# Patient Record
Sex: Female | Born: 1975 | Race: White | Hispanic: No | Smoking: Never smoker
Health system: Southern US, Community
[De-identification: ages and names within clinical notes are randomized; demographics above are authoritative.]

## PROBLEM LIST (undated history)

## (undated) HISTORY — PX: INTRAUTERINE DEVICE (IUD) INSERTION: SHX5877

## (undated) HISTORY — PX: SKIN TAG REMOVAL: SHX780

---

## 1998-12-23 ENCOUNTER — Other Ambulatory Visit: Admission: RE | Admit: 1998-12-23 | Discharge: 1998-12-23 | Payer: Self-pay | Admitting: Family Medicine

## 1999-02-19 ENCOUNTER — Encounter (INDEPENDENT_AMBULATORY_CARE_PROVIDER_SITE_OTHER): Payer: Self-pay

## 1999-02-19 ENCOUNTER — Other Ambulatory Visit: Admission: RE | Admit: 1999-02-19 | Discharge: 1999-02-19 | Payer: Self-pay | Admitting: Obstetrics & Gynecology

## 2000-03-31 ENCOUNTER — Other Ambulatory Visit: Admission: RE | Admit: 2000-03-31 | Discharge: 2000-03-31 | Payer: Self-pay | Admitting: Obstetrics & Gynecology

## 2001-04-02 ENCOUNTER — Other Ambulatory Visit: Admission: RE | Admit: 2001-04-02 | Discharge: 2001-04-02 | Payer: Self-pay | Admitting: Obstetrics & Gynecology

## 2002-04-10 ENCOUNTER — Other Ambulatory Visit: Admission: RE | Admit: 2002-04-10 | Discharge: 2002-04-10 | Payer: Self-pay | Admitting: Obstetrics & Gynecology

## 2003-04-16 ENCOUNTER — Other Ambulatory Visit: Admission: RE | Admit: 2003-04-16 | Discharge: 2003-04-16 | Payer: Self-pay | Admitting: Obstetrics & Gynecology

## 2005-07-08 ENCOUNTER — Ambulatory Visit (HOSPITAL_COMMUNITY): Admission: RE | Admit: 2005-07-08 | Discharge: 2005-07-08 | Payer: Self-pay | Admitting: *Deleted

## 2008-08-06 ENCOUNTER — Encounter: Admission: RE | Admit: 2008-08-06 | Discharge: 2008-08-06 | Payer: Self-pay | Admitting: Specialist

## 2010-07-09 NOTE — Op Note (Signed)
NAME:  Anne Haas, Anne Haas                   ACCOUNT NO.:  [id number]   MEDICAL RECORD NO.:  [id number]          PATIENT TYPE:  AMB   LOCATION:  DAY                          FACILITY:  [facility]   PHYSICIAN:  Alfonse Ras, MD   DATE OF BIRTH:  10-29-1975   DATE OF PROCEDURE:  07/08/2005  DATE OF DISCHARGE:                                 OPERATIVE REPORT   PREOPERATIVE DIAGNOSIS:  Anal skin tag.   POSTOPERATIVE DIAGNOSIS:  Anal skin tag.   PROCEDURE:  Excision of anal skin tag.   SURGEON:  Baruch Merl.   ANESTHESIA:  General.   DESCRIPTION:  The patient was taken to the operating room and placed in the  supine position.  After adequate general anesthesia was induced, the patient  was placed in the lithotomy position and perianal prep was undertaken.  The  anterior anal skin tag was grasped with the smooth graspers and transected  at its base.  The bare area was then coagulated using Bovie electrocautery  until adequate hemostasis was insured.  The defect was closed with a running  locking 3-0 chromic suture.  10 cc of 0.5 Marcaine was then injected under  the incision.  Adequate hemostasis was insured.  4 x 4's were placed and  mesh panties were placed.  The patient was taken to the recovery room in  good condition.      Alfonse Ras, MD  Electronically Signed     KRE/MEDQ  D:  07/08/2005  T:  07/08/2005  Job:  [id number]

## 2013-02-22 ENCOUNTER — Other Ambulatory Visit: Payer: Self-pay | Admitting: Family Medicine

## 2013-02-22 DIAGNOSIS — R109 Unspecified abdominal pain: Secondary | ICD-10-CM

## 2013-02-25 ENCOUNTER — Ambulatory Visit
Admission: RE | Admit: 2013-02-25 | Discharge: 2013-02-25 | Disposition: A | Discharge status: Home / Self Care | Payer: BC Managed Care – PPO | Source: Ambulatory Visit | Attending: Family Medicine | Admitting: Family Medicine

## 2013-02-25 DIAGNOSIS — R109 Unspecified abdominal pain: Secondary | ICD-10-CM

## 2014-11-14 ENCOUNTER — Encounter (HOSPITAL_COMMUNITY): Payer: Self-pay | Admitting: Emergency Medicine

## 2014-11-14 ENCOUNTER — Emergency Department (HOSPITAL_COMMUNITY)
Admission: EM | Admit: 2014-11-14 | Discharge: 2014-11-14 | Disposition: A | Discharge status: Home / Self Care | Payer: 59 | Source: Home / Self Care | Attending: Emergency Medicine | Admitting: Emergency Medicine

## 2014-11-14 DIAGNOSIS — L259 Unspecified contact dermatitis, unspecified cause: Secondary | ICD-10-CM | POA: Diagnosis not present

## 2014-11-14 MED ORDER — PREDNISONE 20 MG PO TABS: ORAL_TABLET | ORAL | Status: DC

## 2014-11-14 NOTE — Discharge Instructions (Signed)
You have poison ivy or poison sumac. Take the prednisone as prescribed. It is important to take the entire course. You can continue the over-the-counter topical agents and oral Benadryl to provide temporary relief of the itching. You should see improvement within the next 24 hours.

## 2014-11-14 NOTE — ED Provider Notes (Signed)
CSN: [id number]     Arrival date & time 11/14/14  1556 History   First MD Initiated Contact with Patient 11/14/14 1612     Chief Complaint  Patient presents with  . Rash   (Consider location/radiation/quality/duration/timing/severity/associated sxs/prior Treatment) HPI  She is a 39 year old woman here for evaluation of rash. She states she was doing yard work over the weekend, and has developed an itchy rash on her arms and legs. She states it has been spreading. It is intensely itchy. She has tried multiple over-the-counter creams, including Benadryl and calamine without improvement. She has also tried oral Benadryl without improvement. She has tried Epsom salts soaks as well and baking soda without improvement.  History reviewed. No pertinent past medical history. History reviewed. No pertinent past surgical history. No family history on file. Social History  Substance Use Topics  . Smoking status: Never Smoker   . Smokeless tobacco: None  . Alcohol Use: No   OB History    No data available     Review of Systems As in history of present illness Allergies  Review of patient's allergies indicates no known allergies.  Home Medications   Prior to Admission medications   Medication Sig Start Date End Date Taking? Authorizing Provider  levonorgestrel (MIRENA) 20 MCG/24HR IUD 1 each by Intrauterine route once.   Yes Historical Provider, MD  predniSONE (DELTASONE) 20 MG tablet Take 3 tablets daily for 5 days, then 2 tablets daily for 3 days, then 1 tablet daily for 3 days, then 1/2 tablet daily for 3 days 11/14/14   Melony Overly, MD   Meds Ordered and Administered this Visit  Medications - No data to display  BP 134/76 mmHg  Pulse 59  Temp(Src) 98.7 F (37.1 C) (Oral)  Resp 20  SpO2 100% No data found.   Physical Exam  Constitutional: She is oriented to person, place, and time. She appears well-developed and well-nourished. No distress.  Cardiovascular: Normal rate.    Pulmonary/Chest: Effort normal.  Neurological: She is alert and oriented to person, place, and time.  Skin: Rash (erythematous papulovesicular rash in linear pattern on arms and legs.) noted.    ED Course  Procedures (including critical care time)  Labs Review Labs Reviewed - No data to display  Imaging Review No results found.    MDM   1. Contact dermatitis    Treatment prednisone taper. Follow-up as needed.    Melony Overly, MD 11/14/14 [phone]

## 2014-11-14 NOTE — ED Notes (Signed)
Here for poss poison ivy/oak onset Sunday all over body Reports she was doing yard work Has tried OTC creams w/no relief Alert and oriented x4... No acute distress.

## 2015-02-25 DIAGNOSIS — D225 Melanocytic nevi of trunk: Secondary | ICD-10-CM | POA: Diagnosis not present

## 2015-02-25 DIAGNOSIS — D2239 Melanocytic nevi of other parts of face: Secondary | ICD-10-CM | POA: Diagnosis not present

## 2015-02-25 DIAGNOSIS — D2271 Melanocytic nevi of right lower limb, including hip: Secondary | ICD-10-CM | POA: Diagnosis not present

## 2015-02-25 DIAGNOSIS — L858 Other specified epidermal thickening: Secondary | ICD-10-CM | POA: Diagnosis not present

## 2015-02-27 DIAGNOSIS — Z30433 Encounter for removal and reinsertion of intrauterine contraceptive device: Secondary | ICD-10-CM | POA: Diagnosis not present

## 2015-03-26 DIAGNOSIS — Z30431 Encounter for routine checking of intrauterine contraceptive device: Secondary | ICD-10-CM | POA: Diagnosis not present

## 2015-11-30 DIAGNOSIS — B349 Viral infection, unspecified: Secondary | ICD-10-CM | POA: Diagnosis not present

## 2016-01-22 DIAGNOSIS — H1851 Endothelial corneal dystrophy: Secondary | ICD-10-CM | POA: Diagnosis not present

## 2016-01-22 DIAGNOSIS — H5213 Myopia, bilateral: Secondary | ICD-10-CM | POA: Diagnosis not present

## 2016-04-27 DIAGNOSIS — L858 Other specified epidermal thickening: Secondary | ICD-10-CM | POA: Diagnosis not present

## 2016-04-27 DIAGNOSIS — D225 Melanocytic nevi of trunk: Secondary | ICD-10-CM | POA: Diagnosis not present

## 2016-04-27 DIAGNOSIS — D2222 Melanocytic nevi of left ear and external auricular canal: Secondary | ICD-10-CM | POA: Diagnosis not present

## 2016-04-27 DIAGNOSIS — D2239 Melanocytic nevi of other parts of face: Secondary | ICD-10-CM | POA: Diagnosis not present

## 2016-04-27 DIAGNOSIS — D224 Melanocytic nevi of scalp and neck: Secondary | ICD-10-CM | POA: Diagnosis not present

## 2016-08-04 ENCOUNTER — Ambulatory Visit (INDEPENDENT_AMBULATORY_CARE_PROVIDER_SITE_OTHER): Payer: 59 | Admitting: Emergency Medicine

## 2016-08-04 ENCOUNTER — Encounter: Payer: Self-pay | Admitting: Emergency Medicine

## 2016-08-04 VITALS — BP 124/79 | HR 72 | Temp 99.1°F | Resp 16 | Ht 65.0 in | Wt 171.4 lb

## 2016-08-04 DIAGNOSIS — W57XXXA Bitten or stung by nonvenomous insect and other nonvenomous arthropods, initial encounter: Secondary | ICD-10-CM

## 2016-08-04 DIAGNOSIS — L03221 Cellulitis of neck: Secondary | ICD-10-CM

## 2016-08-04 MED ORDER — DOXYCYCLINE HYCLATE 100 MG PO TABS: 100.0000 mg | ORAL_TABLET | Freq: Two times a day (BID) | ORAL | 0 refills | Status: DC

## 2016-08-04 NOTE — Patient Instructions (Addendum)
   IF you received an x-ray today, you will receive an invoice from [facility]. Please contact [facility] at [phone] with questions or concerns regarding your invoice.   IF you received labwork today, you will receive an invoice from [facility]. Please contact [facility] at [phone] with questions or concerns regarding your invoice.   Our billing staff will not be able to assist you with questions regarding bills from these companies.  You will be contacted with the lab results as soon as they are available. The fastest way to get your results is to activate your My Chart account. Instructions are located on the last page of this paperwork. If you have not heard from us regarding the results in 2 weeks, please contact this office.      Insect Bite, Adult An insect bite can make your skin red, itchy, and swollen. Some insects can spread disease to people with a bite. However, most insect bites do not lead to disease, and most are not serious. Follow these instructions at home: Bite area care  Do not scratch the bite area.  Keep the bite area clean and dry.  Wash the bite area every day with soap and water as told by your doctor.  Check the bite area every day for signs of infection. Check for: ? More redness, swelling, or pain. ? Fluid or blood. ? Warmth. ? Pus. Managing pain, itching, and swelling  You may put any of these on the bite area as told by your doctor: ? A baking soda paste. ? Cortisone cream. ? Calamine lotion.  If directed, put ice on the bite area. ? Put ice in a plastic bag. ? Place a towel between your skin and the bag. ? Leave the ice on for 20 minutes, 2-3 times a day. Medicines  Take medicines or put medicines on your skin only as told by your doctor.  If you were prescribed an antibiotic medicine, use it as told by your doctor. Do not stop using the antibiotic even if your condition improves. General instructions  Keep all  follow-up visits as told by your doctor. This is important. How is this prevented? To help you have a lower risk of insect bites:  When you are outside, wear clothing that covers your arms and legs.  Use insect repellent. The best insect repellents have: ? An active ingredient of DEET, picaridin, oil of lemon eucalyptus (OLE), or IR3535. ? Higher amounts of DEET or another active ingredient than other repellents have.  If your home windows do not have screens, think about putting some in.  Contact a doctor if:  You have more redness, swelling, or pain in the bite area.  You have fluid, blood, or pus coming from the bite area.  The bite area feels warm.  You have a fever. Get help right away if:  You have joint pain.  You have a rash.  You have shortness of breath.  You feel more tired or sleepy than you normally do.  You have neck pain.  You have a headache.  You feel weaker than you normally do.  You have chest pain.  You have pain in your belly.  You feel sick to your stomach (nauseous) or you throw up (vomit). Summary  An insect bite can make your skin red, itchy, and swollen.  Do not scratch the bite area, and keep it clean and dry.  Ice can help with pain and itching from the bite. This information is   not intended to replace advice given to you by your health care provider. Make sure you discuss any questions you have with your health care provider. Document Released: 02/05/2000 Document Revised: 09/10/2015 Document Reviewed: 06/25/2014 Elsevier Interactive Patient Education  2018 Elsevier Inc.  

## 2016-08-04 NOTE — Progress Notes (Addendum)
Anne Haas 41 y.o.   Chief Complaint  Patient presents with  . Insect Bite    On neck, redness on neck since Monday     HISTORY OF PRESENT ILLNESS: This is a 41 y.o. female complaining of insect bite to left side of neck 3 days ago followed by worsening redness and itching.  HPI   Prior to Admission medications   Medication Sig Start Date End Date Taking? Authorizing Provider  levonorgestrel (MIRENA) 20 MCG/24HR IUD 1 each by Intrauterine route once.   Yes [provider]  doxycycline (VIBRA-TABS) 100 MG tablet Take 1 tablet (100 mg total) by mouth 2 (two) times daily. 08/04/16   Horald Pollen, MD  predniSONE (DELTASONE) 20 MG tablet Take 3 tablets daily for 5 days, then 2 tablets daily for 3 days, then 1 tablet daily for 3 days, then 1/2 tablet daily for 3 days Patient not taking: Reported on 08/04/2016 11/14/14   Melony Overly, MD    Allergies  Allergen Reactions  . Septra [Sulfamethoxazole-Trimethoprim] Diarrhea    Patient Active Problem List   Diagnosis Date Noted  . Bite, insect 08/04/2016    No past medical history on file.  No past surgical history on file.  Social History   Social History  . Marital status: Single    Spouse name: N/A  . Number of children: N/A  . Years of education: N/A   Occupational History  . Not on file.   Social History Main Topics  . Smoking status: Never Smoker  . Smokeless tobacco: Not on file  . Alcohol use No  . Drug use: No  . Sexual activity: Not on file   Other Topics Concern  . Not on file   Social History Narrative  . No narrative on file    No family history on file.   Review of Systems  Constitutional: Negative for chills and fever.  Respiratory: Negative for shortness of breath.   Gastrointestinal: Negative for nausea and vomiting.  Skin: Positive for rash.  Neurological: Negative for dizziness and headaches.  All other systems reviewed and are negative.  Vitals:   08/04/16 1632  BP:  124/79  Pulse: 72  Resp: 16  Temp: 99.1 F (37.3 C)     Physical Exam  Constitutional: She appears well-developed and well-nourished.  HENT:  Head: Normocephalic and atraumatic.  Eyes: EOM are normal. Pupils are equal, round, and reactive to light.  Neck: Normal range of motion. Neck supple.  Cardiovascular: Normal rate and regular rhythm.   Pulmonary/Chest: Effort normal.  Musculoskeletal: Normal range of motion.  Skin: Skin is warm and dry. Capillary refill takes less than 2 seconds. Rash (left neck: +erythema, circular area 4-5 cm diameter) noted.  Psychiatric: She has a normal mood and affect. Her behavior is normal.  Vitals reviewed.    ASSESSMENT & PLAN: Anne Haas was seen today for insect bite.  Diagnoses and all orders for this visit:  Cellulitis of neck  Insect bite, initial encounter Comments: with infection  Other orders -     doxycycline (VIBRA-TABS) 100 MG tablet; Take 1 tablet (100 mg total) by mouth 2 (two) times daily.    Patient Instructions       IF you received an x-ray today, you will receive an invoice from [facility]. Please contact [facility] at [phone] with questions or concerns regarding your invoice.   IF you received labwork today, you will receive an invoice from [facility]. Please contact [facility] at [phone]  with questions or concerns regarding your invoice.   Our billing staff will not be able to assist you with questions regarding bills from these companies.  You will be contacted with the lab results as soon as they are available. The fastest way to get your results is to activate your My Chart account. Instructions are located on the last page of this paperwork. If you have not heard from [place] regarding the results in 2 weeks, please contact this office.      Insect Bite, Adult An insect bite can make your skin red, itchy, and swollen. Some insects can spread disease to people with a bite. However, most  insect bites do not lead to disease, and most are not serious. Follow these instructions at home: Bite area care  Do not scratch the bite area.  Keep the bite area clean and dry.  Wash the bite area every day with soap and water as told by your doctor.  Check the bite area every day for signs of infection. Check for: ? More redness, swelling, or pain. ? Fluid or blood. ? Warmth. ? Pus. Managing pain, itching, and swelling  You may put any of these on the bite area as told by your doctor: ? A baking soda paste. ? Cortisone cream. ? Calamine lotion.  If directed, put ice on the bite area. ? Put ice in a plastic bag. ? Place a towel between your skin and the bag. ? Leave the ice on for 20 minutes, 2-3 times a day. Medicines  Take medicines or put medicines on your skin only as told by your doctor.  If you were prescribed an antibiotic medicine, use it as told by your doctor. Do not stop using the antibiotic even if your condition improves. General instructions  Keep all follow-up visits as told by your doctor. This is important. How is this prevented? To help you have a lower risk of insect bites:  When you are outside, wear clothing that covers your arms and legs.  Use insect repellent. The best insect repellents have: ? An active ingredient of DEET, picaridin, oil of lemon eucalyptus (OLE), or IR3535. ? Higher amounts of DEET or another active ingredient than other repellents have.  If your home windows do not have screens, think about putting some in.  Contact a doctor if:  You have more redness, swelling, or pain in the bite area.  You have fluid, blood, or pus coming from the bite area.  The bite area feels warm.  You have a fever. Get help right away if:  You have joint pain.  You have a rash.  You have shortness of breath.  You feel more tired or sleepy than you normally do.  You have neck pain.  You have a headache.  You feel weaker than you  normally do.  You have chest pain.  You have pain in your belly.  You feel sick to your stomach (nauseous) or you throw up (vomit). Summary  An insect bite can make your skin red, itchy, and swollen.  Do not scratch the bite area, and keep it clean and dry.  Ice can help with pain and itching from the bite. This information is not intended to replace advice given to you by your health care provider. Make sure you discuss any questions you have with your health care provider. Document Released: 02/05/2000 Document Revised: 09/10/2015 Document Reviewed: 06/25/2014 Elsevier Interactive Patient Education  2018 [employer].  Agustina Caroli, MD [facility]

## 2017-02-01 DIAGNOSIS — H5213 Myopia, bilateral: Secondary | ICD-10-CM | POA: Diagnosis not present

## 2017-06-12 ENCOUNTER — Ambulatory Visit: Payer: No Typology Code available for payment source | Admitting: Obstetrics & Gynecology

## 2017-06-12 VITALS — Ht 65.0 in | Wt 162.0 lb

## 2017-06-12 DIAGNOSIS — N6320 Unspecified lump in the left breast, unspecified quadrant: Secondary | ICD-10-CM | POA: Diagnosis not present

## 2017-06-12 NOTE — Progress Notes (Signed)
    Anne Haas 1975-09-03 [id number]        42 y.o.  G1P0A1 Single  RP: Left breast lumps x 3 weeks  HPI:  C/O feeling small lumps on the left breast x 3 weeks.  Patient noticed the area after being pushed by her horse's nose at that location.  Felt tender, so started palpating that area and felt it was lumpy.  No longer tender.  No change in skin, no nipple discharge.  No family h/o Breast Ca.  No screening Mammo done yet.  Well on Mirena IUD.  Abstinent.   OB History  No data available    Past medical history,surgical history, problem list, medications, allergies, family history and social history were all reviewed and documented in the EPIC chart.   Directed ROS with pertinent positives and negatives documented in the history of present illness/assessment and plan.  Exam:  Vitals:   06/12/17 1457  Weight: 162 lb (73.5 kg)  Height: 5\' 5"  (1.651 m)   General appearance:  Normal  Breast exam:  Physical Exam  Respiratory:         Assessment/Plan:  42 y.o. No obstetric history on file.   1. Left breast lump Tenderness of the mid inner left breast after mild trauma 3 weeks ago.  Patient felt small lumps in that area since that time.  On exam today no clear nodule or mass, but mildly increased density at the inner mid left breast which correspond to the 9:00 area.  Bilateral breast exam otherwise negative.  No family history of breast cancer.  Decision to proceed with a left diagnostic mammogram and ultrasound.  Patient reassured.  Counseling on above issues and coordination of care more than 50% for 15 minutes.  Princess Bruins MD, 3:22 PM 06/12/2017

## 2017-06-15 ENCOUNTER — Telehealth: Payer: Self-pay | Admitting: *Deleted

## 2017-06-15 ENCOUNTER — Encounter: Payer: Self-pay | Admitting: Obstetrics & Gynecology

## 2017-06-15 DIAGNOSIS — N6320 Unspecified lump in the left breast, unspecified quadrant: Secondary | ICD-10-CM

## 2017-06-15 NOTE — Patient Instructions (Signed)
1. Left breast lump Tenderness of the mid inner left breast after mild trauma 3 weeks ago.  Patient felt small lumps in that area since that time.  On exam today no clear nodule or mass, but mildly increased density at the inner mid left breast which correspond to the 9:00 area.  Bilateral breast exam otherwise negative.  No family history of breast cancer.  Decision to proceed with a left diagnostic mammogram and ultrasound.  Patient reassured.  Anne Haas, it was a pleasure seeing you today!  I will review your Diagnostic Mammogram and [place] as soon as available.

## 2017-06-15 NOTE — Telephone Encounter (Signed)
Patient scheduled on 06/19/17 @ 7:50am at breast center, pt informed.

## 2017-06-15 NOTE — Telephone Encounter (Signed)
-----   Message from Princess Bruins, MD sent at 06/12/2017  3:38 PM EDT ----- Regarding: Left breast Dx Mammo/US Left breast increased density at 9 O'clock at periphery of breast.  No other findings.  Lt Dx mammo/US.  Right Screening Mammo as well.

## 2017-06-19 ENCOUNTER — Ambulatory Visit
Admission: RE | Admit: 2017-06-19 | Discharge: 2017-06-19 | Disposition: A | Discharge status: Home / Self Care | Payer: No Typology Code available for payment source | Source: Ambulatory Visit | Attending: Obstetrics & Gynecology | Admitting: Obstetrics & Gynecology

## 2017-06-19 ENCOUNTER — Ambulatory Visit
Admission: RE | Admit: 2017-06-19 | Discharge: 2017-06-19 | Disposition: A | Discharge status: Home / Self Care | Payer: 59 | Source: Ambulatory Visit | Attending: Obstetrics & Gynecology | Admitting: Obstetrics & Gynecology

## 2017-06-19 DIAGNOSIS — N6320 Unspecified lump in the left breast, unspecified quadrant: Secondary | ICD-10-CM

## 2017-07-25 ENCOUNTER — Encounter: Payer: Self-pay | Admitting: Obstetrics & Gynecology

## 2017-07-25 ENCOUNTER — Ambulatory Visit (INDEPENDENT_AMBULATORY_CARE_PROVIDER_SITE_OTHER): Payer: No Typology Code available for payment source | Admitting: Obstetrics & Gynecology

## 2017-07-25 VITALS — BP 126/78 | Ht 65.0 in | Wt 159.0 lb

## 2017-07-25 DIAGNOSIS — Z1151 Encounter for screening for human papillomavirus (HPV): Secondary | ICD-10-CM | POA: Diagnosis not present

## 2017-07-25 DIAGNOSIS — Z30431 Encounter for routine checking of intrauterine contraceptive device: Secondary | ICD-10-CM

## 2017-07-25 DIAGNOSIS — Z01419 Encounter for gynecological examination (general) (routine) without abnormal findings: Secondary | ICD-10-CM | POA: Diagnosis not present

## 2017-07-25 NOTE — Progress Notes (Signed)
Anne Haas 03/21/1975 [id number]   History:    42 y.o. G1P0A1 Single.  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  RP:  Established patient presenting for annual gyn exam.  Recent Mammo, negative.   HPI: Well on Mirena IUD x 02/2014.  No breakthrough bleeding.  No pelvic pain.  Normal vaginal secretions.  Abstinent since last exam.  Urine and bowel movements normal.  Breasts normal.  Screening mammogram negative in April 2019.  Will follow-up fasting for health labs  here.  Normal gynecologic exam.  Past medical history,surgical history, family history and social history were all reviewed and documented in the EPIC chart.  Gynecologic History No LMP recorded. (Menstrual status: IUD). Contraception: Mirena IUD x 02/2014 Last Pap: 2016. Results were: normal Last mammogram: 05/2017. Results were: Negative Bone Density: Never Colonoscopy: Never  Obstetric History OB History  Gravida Para Term Preterm AB Living  1 0     1 0  SAB TAB Ectopic Multiple Live Births               # Outcome Date GA Lbr Len/2nd Weight Sex Delivery Anes PTL Lv  1 AB              ROS: A ROS was performed and pertinent positives and negatives are included in the history.  GENERAL: No fevers or chills. HEENT: No change in vision, no earache, sore throat or sinus congestion. NECK: No pain or stiffness. CARDIOVASCULAR: No chest pain or pressure. No palpitations. PULMONARY: No shortness of breath, cough or wheeze. GASTROINTESTINAL: No abdominal pain, nausea, vomiting or diarrhea, melena or bright red blood per rectum. GENITOURINARY: No urinary frequency, urgency, hesitancy or dysuria. MUSCULOSKELETAL: No joint or muscle pain, no back pain, no recent trauma. DERMATOLOGIC: No rash, no itching, no lesions. ENDOCRINE: No polyuria, polydipsia, no heat or cold intolerance. No recent change in weight. HEMATOLOGICAL: No anemia or easy bruising or bleeding. NEUROLOGIC: No headache, seizures, numbness,  tingling or weakness. PSYCHIATRIC: No depression, no loss of interest in normal activity or change in sleep pattern.     Exam:   BP 126/78   Ht '5\' 5"'$  (1.651 m)   Wt 159 lb (72.1 kg)   BMI 26.46 kg/m   Body mass index is 26.46 kg/m.  General appearance : Well developed well nourished female. No acute distress HEENT: Eyes: no retinal hemorrhage or exudates,  Neck supple, trachea midline, no carotid bruits, no thyroidmegaly Lungs: Clear to auscultation, no rhonchi or wheezes, or rib retractions  Heart: Regular rate and rhythm, no murmurs or gallops Breast:Examined in sitting and supine position were symmetrical in appearance, no palpable masses or tenderness,  no skin retraction, no nipple inversion, no nipple discharge, no skin discoloration, no axillary or supraclavicular lymphadenopathy Abdomen: no palpable masses or tenderness, no rebound or guarding Extremities: no edema or skin discoloration or tenderness  Pelvic: Vulva: Normal             Vagina: No gross lesions or discharge  Cervix: No gross lesions or discharge.  IUD strings seen.  Pap/HR HPV done.  Uterus  AV, normal size, shape and consistency, non-tender and mobile  Adnexa  Without masses or tenderness  Anus: Normal   Assessment/Plan:  42 y.o. female for annual exam   1. Encounter for routine gynecological examination with Papanicolaou smear of cervix Normal gynecologic exam.  Pap with high-risk HPV done today.  Breast exam normal.  Screening mammogram -April 2019.  Follow-up health  labs here. - CBC; Future - Comp Met (CMET); Future - Lipid panel; Future - TSH; Future - VITAMIN D 25 Hydroxy (Vit-D Deficiency, Fractures); Future - PAP,TP IMGw/HPV RNA,rflx HPVTYPE16,18/45  2. Encounter for routine checking of intrauterine contraceptive device (IUD) Mirena IUD inserted in 2016.  Well-tolerated and in good position.  3. Special screening examination for human papillomavirus (HPV)  - PAP,TP IMGw/HPV RNA,rflx  [id number],81/18  Princess Bruins MD, 3:45 PM 07/25/2017

## 2017-07-26 LAB — PAP, TP IMAGING W/ HPV RNA, RFLX HPV TYPE 16,18/45: HPV DNA High Risk: NOT DETECTED

## 2017-07-29 ENCOUNTER — Encounter: Payer: Self-pay | Admitting: Obstetrics & Gynecology

## 2017-07-29 NOTE — Patient Instructions (Signed)
1. Encounter for routine gynecological examination with Papanicolaou smear of cervix Normal gynecologic exam.  Pap with high-risk HPV done today.  Breast exam normal.  Screening mammogram -April 2019.  Follow-up health labs here. - CBC; Future - Comp Met (CMET); Future - Lipid panel; Future - TSH; Future - VITAMIN D 25 Hydroxy (Vit-D Deficiency, Fractures); Future - PAP,TP IMGw/HPV RNA,rflx HPVTYPE16,18/45  2. Encounter for routine checking of intrauterine contraceptive device (IUD) Mirena IUD inserted in 2016.  Well-tolerated and in good position.  3. Special screening examination for human papillomavirus (HPV)  - PAP,TP IMGw/HPV RNA,rflx [id number],25/49  Rola, it was a pleasure seeing you today!  I will inform you of your results as soon as they are available.

## 2017-08-11 ENCOUNTER — Other Ambulatory Visit: Payer: No Typology Code available for payment source

## 2017-08-11 DIAGNOSIS — Z01419 Encounter for gynecological examination (general) (routine) without abnormal findings: Secondary | ICD-10-CM

## 2017-08-12 LAB — COMPREHENSIVE METABOLIC PANEL
AG RATIO: 2 (calc) (ref 1.0–2.5)
ALBUMIN MSPROF: 4.4 g/dL (ref 3.6–5.1)
ALKALINE PHOSPHATASE (APISO): 54 U/L (ref 33–115)
ALT: 12 U/L (ref 6–29)
AST: 13 U/L (ref 10–30)
BILIRUBIN TOTAL: 0.8 mg/dL (ref 0.2–1.2)
BUN: 15 mg/dL (ref 7–25)
CALCIUM: 9.4 mg/dL (ref 8.6–10.2)
CHLORIDE: 105 mmol/L (ref 98–110)
CO2: 30 mmol/L (ref 20–32)
Creat: 0.84 mg/dL (ref 0.50–1.10)
GLOBULIN: 2.2 g/dL (ref 1.9–3.7)
Glucose, Bld: 87 mg/dL (ref 65–99)
POTASSIUM: 4.2 mmol/L (ref 3.5–5.3)
Sodium: 141 mmol/L (ref 135–146)
Total Protein: 6.6 g/dL (ref 6.1–8.1)

## 2017-08-12 LAB — TSH: TSH: 2.07 mIU/L

## 2017-08-12 LAB — CBC
HCT: 39.9 % (ref 35.0–45.0)
Hemoglobin: 13.7 g/dL (ref 11.7–15.5)
MCH: 31.1 pg (ref 27.0–33.0)
MCHC: 34.3 g/dL (ref 32.0–36.0)
MCV: 90.5 fL (ref 80.0–100.0)
MPV: 10.1 fL (ref 7.5–12.5)
Platelets: 276 10*3/uL (ref 140–400)
RBC: 4.41 10*6/uL (ref 3.80–5.10)
RDW: 11.7 % (ref 11.0–15.0)
WBC: 6.1 10*3/uL (ref 3.8–10.8)

## 2017-08-12 LAB — LIPID PANEL
CHOLESTEROL: 181 mg/dL (ref <200)
HDL: 54 mg/dL (ref >50)
LDL Cholesterol (Calc): 113 mg/dL (calc) — ABNORMAL HIGH
Non-HDL Cholesterol (Calc): 127 mg/dL (calc) (ref <130)
Total CHOL/HDL Ratio: 3.4 (calc) (ref <5.0)
Triglycerides: 53 mg/dL (ref <150)

## 2017-08-12 LAB — VITAMIN D 25 HYDROXY (VIT D DEFICIENCY, FRACTURES): VIT D 25 HYDROXY: 34 ng/mL (ref 30–100)

## 2018-02-14 ENCOUNTER — Telehealth: Payer: No Typology Code available for payment source | Admitting: Family

## 2018-02-14 DIAGNOSIS — R197 Diarrhea, unspecified: Secondary | ICD-10-CM

## 2018-02-14 NOTE — Progress Notes (Signed)
Based on what you shared with me it looks like you have a serious condition that should be evaluated in a face to face office visit.  NOTE: If you entered your credit card information for this eVisit, you will not be charged. You may see a "hold" on your card for the $30 but that hold will drop off and you will not have a charge processed.  If you are having a true medical emergency please call 911.  If you need an urgent face to face visit, [facility] has four urgent care centers for your convenience.  If you need care fast and have a high deductible or no insurance consider:   [email or web address] to reserve your spot online an avoid wait times  [facility] [address], [address] [place], [place] [address] 8 am to 8 pm Monday-Friday 10 am to 4 pm Saturday-Sunday *Across the street from [employer]  [facility]  [address] [place], [address] 8 am to 5 pm Monday-Friday * In the [facility] the [facility]   The following sites will take your  insurance:  . [facility]  [phone] Get Driving Directions Find a Provider at this Location  [address] [place], [place] [address] . 10 am to 8 pm Monday-Friday . 12 pm to 8 pm Saturday-Sunday   . [facility]  [phone] Get Driving Directions Find a Provider at this Location  [address], [address] , [place] [address] . 8 am to 8 pm Monday-Friday . 9 am to 6 pm Saturday . 11 am to 6 pm Sunday   .  [facility] [place]  [phone] Get Driving Directions  [address] [place],  [address] . 8 am to 8 pm Monday-Friday . 8 am to 4 pm Saturday-Sunday   Your e-visit answers were reviewed by a board certified advanced clinical practitioner to complete your personal care plan.  Thank you for using e-Visits.  

## 2018-05-06 ENCOUNTER — Encounter (HOSPITAL_COMMUNITY): Payer: Self-pay | Admitting: Emergency Medicine

## 2018-05-06 ENCOUNTER — Ambulatory Visit (HOSPITAL_COMMUNITY)
Admission: EM | Admit: 2018-05-06 | Discharge: 2018-05-06 | Disposition: A | Discharge status: Home / Self Care | Payer: No Typology Code available for payment source | Attending: Internal Medicine | Admitting: Internal Medicine

## 2018-05-06 ENCOUNTER — Other Ambulatory Visit: Payer: Self-pay

## 2018-05-06 DIAGNOSIS — R6889 Other general symptoms and signs: Secondary | ICD-10-CM | POA: Insufficient documentation

## 2018-05-06 LAB — INFLUENZA PANEL BY PCR (TYPE A & B)
INFLBPCR: NEGATIVE
Influenza A By PCR: NEGATIVE

## 2018-05-06 LAB — POCT RAPID STREP A: STREPTOCOCCUS, GROUP A SCREEN (DIRECT): NEGATIVE

## 2018-05-06 MED ORDER — OSELTAMIVIR PHOSPHATE 75 MG PO CAPS: 75.0000 mg | ORAL_CAPSULE | Freq: Two times a day (BID) | ORAL | 0 refills | Status: DC

## 2018-05-06 NOTE — Discharge Instructions (Signed)
Drink lots of fluids, bone broth which helps boost your immune system ans well as Zinc lozenges.

## 2018-05-06 NOTE — ED Provider Notes (Signed)
[place]    CSN: [id number] Arrival date & time: 05/06/18  1415     History   Chief Complaint Chief Complaint  Patient presents with  . Flu-Like Symptoms    HPI Anne Haas is a 43 y.o. female.   Onset of rhintiis yesterday pm, and started some aches, HA, fever with body aches last night and feels worse today. This am woke up and temp was 100, and later on after taking Advil 400 mg 2 h ago her temp went up to 101. Has mild cough only. No wheezing, SOB, chest pains. 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 She usually never gets sick. Did have a flu shot this year.      History reviewed. No pertinent past medical history.  Patient Active Problem List   Diagnosis Date Noted  . Bite, insect 08/04/2016    History reviewed. No pertinent surgical history.  OB History    Gravida  1   Para  0   Term      Preterm      AB  1   Living  0     SAB      TAB      Ectopic      Multiple      Live Births               Home Medications    Prior to Admission medications   Medication Sig Start Date End Date Taking? Authorizing Provider  levonorgestrel (MIRENA) 20 MCG/24HR IUD 1 each by Intrauterine route once.    [provider]  oseltamivir (TAMIFLU) 75 MG capsule Take 1 capsule (75 mg total) by mouth every 12 (twelve) hours. 05/06/18   Rodriguez-Southworth, Sunday Spillers, PA-C    Family History Family History  Problem Relation Age of Onset  . Hypertension Mother   . Heart attack Mother   . Stroke Father   . Diabetes Brother   . Cancer Maternal Grandfather        pancreatic  . Diabetes Paternal Grandfather     Social History Social History   Tobacco Use  . Smoking status: Never Smoker  . Smokeless tobacco: Never Used  Substance Use Topics  . Alcohol use: No  . Drug use: No     Allergies   Neomycin; Septra [sulfamethoxazole-trimethoprim]; and Shrimp [shellfish allergy]   Review of Systems Review of Systems   Constitutional: Positive for chills, fatigue and fever. Negative for diaphoresis.  HENT: Positive for rhinorrhea and sore throat. Negative for congestion, ear discharge, ear pain, trouble swallowing and voice change.   Eyes: Negative for discharge.  Respiratory: Positive for cough. Negative for chest tightness, shortness of breath and wheezing.   Cardiovascular: Negative for chest pain.  Gastrointestinal: Negative for diarrhea, nausea and vomiting.  Genitourinary: Negative for difficulty urinating.  Skin: Negative for rash.  Neurological: Positive for headaches. Negative for dizziness.  Hematological: Negative for adenopathy.     Physical Exam Triage Vital Signs ED Triage Vitals  Enc Vitals Group     BP 05/06/18 1422 122/73     Pulse Rate 05/06/18 1422 98     Resp 05/06/18 1422 20     Temp 05/06/18 1422 99 F (37.2 C)     Temp Source 05/06/18 1422 Temporal     SpO2 05/06/18 1422 98 %     Weight --      Height --      Head Circumference --  Peak Flow --      Pain Score 05/06/18 1423 3     Pain Loc --      Pain Edu? --      Excl. in [place]? --    No data found.  Updated Vital Signs BP 122/73 (BP Location: Right Arm)   Pulse 98   Temp 99 F (37.2 C) (Temporal) Comment: Ibuprofen @ 1230  Resp 20   SpO2 98%   Visual Acuity Right Eye Distance:   Left Eye Distance:   Bilateral Distance:    Right Eye Near:   Left Eye Near:    Bilateral Near:     Physical Exam Vitals signs and nursing note reviewed.  Constitutional:      General: She is not in acute distress.    Appearance: She is ill-appearing. She is not toxic-appearing or diaphoretic.  HENT:     Right Ear: Tympanic membrane, ear canal and external ear normal.     Left Ear: Tympanic membrane, ear canal and external ear normal.     Nose: Nose normal.     Mouth/Throat:     Mouth: Mucous membranes are moist.     Pharynx: Posterior oropharyngeal erythema present. No oropharyngeal exudate.  Eyes:     General: No  scleral icterus.       Right eye: No discharge.        Left eye: No discharge.     Conjunctiva/sclera: Conjunctivae normal.  Neck:     Musculoskeletal: Neck supple. No neck rigidity.  Cardiovascular:     Heart sounds: No murmur.  Pulmonary:     Effort: Pulmonary effort is normal. No respiratory distress.     Breath sounds: Normal breath sounds. No wheezing or rales.  Musculoskeletal: Normal range of motion.  Lymphadenopathy:     Cervical: No cervical adenopathy.  Skin:    General: Skin is warm and dry.  Neurological:     Mental Status: She is alert and oriented to person, place, and time.     Gait: Gait normal.  Psychiatric:        Mood and Affect: Mood normal.        Behavior: Behavior normal.        Thought Content: Thought content normal.        Judgment: Judgment normal.    UC Treatments / Results  Labs (all labs ordered are listed, but only abnormal results are displayed) Labs Reviewed  CULTURE, GROUP A STREP [facility])  INFLUENZA PANEL BY PCR (TYPE A & B)  Flu test pending( takes 4h) Strep is negative.   EKG None  Radiology No results found.  Procedures Procedures  Medications Ordered in UC Medications - No data to display  Initial Impression / Assessment and Plan / UC Course  I have reviewed the triage vital signs and the nursing notes. I have a strong suspicion she has influenza and started her on Tamiflu. I will call her in the am when the result is back. In the mean time needs to stay home til she hears back from me. She does not have Corona virus symptoms right now.  Pertinent labs  results that were available during my care of the patient were reviewed by me and considered in my medical decision making (see chart for details).  Final Clinical Impressions(s) / UC Diagnoses   Final diagnoses:  Flu-like symptoms     Discharge Instructions     Drink lots of fluids, bone broth which helps boost your immune system  ans well as Zinc lozenges.     ED  Prescriptions    Medication Sig Dispense Auth. Provider   oseltamivir (TAMIFLU) 75 MG capsule Take 1 capsule (75 mg total) by mouth every 12 (twelve) hours. 10 capsule Rodriguez-Southworth, Sunday Spillers, PA-C     Controlled Substance Prescriptions [place] Controlled Substance Registry consulted?    Shelby Mattocks, PA-C 05/06/18 1528

## 2018-05-06 NOTE — ED Triage Notes (Signed)
Pt presents to [facility] for assessment of fever, sore throat, cough, congestion, body aches, nausea with strong cough.

## 2018-05-08 ENCOUNTER — Telehealth (HOSPITAL_COMMUNITY): Payer: Self-pay | Admitting: Emergency Medicine

## 2018-05-08 MED ORDER — BENZONATATE 100 MG PO CAPS: 200.0000 mg | ORAL_CAPSULE | Freq: Two times a day (BID) | ORAL | 0 refills | Status: DC

## 2018-05-08 NOTE — Telephone Encounter (Signed)
Pt called asking about medicine for cough and congestion.

## 2018-05-09 LAB — CULTURE, GROUP A STREP (THRC)

## 2018-07-26 ENCOUNTER — Other Ambulatory Visit: Payer: Self-pay

## 2018-07-27 ENCOUNTER — Other Ambulatory Visit: Payer: Self-pay

## 2018-07-27 ENCOUNTER — Encounter: Payer: Self-pay | Admitting: Obstetrics & Gynecology

## 2018-07-27 ENCOUNTER — Ambulatory Visit (INDEPENDENT_AMBULATORY_CARE_PROVIDER_SITE_OTHER): Payer: No Typology Code available for payment source | Admitting: Obstetrics & Gynecology

## 2018-07-27 VITALS — BP 130/80 | Ht 64.75 in | Wt 154.0 lb

## 2018-07-27 DIAGNOSIS — Z01419 Encounter for gynecological examination (general) (routine) without abnormal findings: Secondary | ICD-10-CM

## 2018-07-27 DIAGNOSIS — Z8619 Personal history of other infectious and parasitic diseases: Secondary | ICD-10-CM

## 2018-07-27 DIAGNOSIS — Z30431 Encounter for routine checking of intrauterine contraceptive device: Secondary | ICD-10-CM

## 2018-07-27 NOTE — Patient Instructions (Signed)
1. Well female exam with routine gynecological exam Normal gynecologic exam.  Pap test negative with negative high-risk HPV in June 2019, no indication to repeat this year.  Breast exam normal.  Screening mammogram April 2019 was negative, will repeat screening now.  Fasting health labs here today.  Good body mass index at 25.83.  Continue with fitness and healthy nutrition. - CBC - Comp Met (CMET) - TSH - Lipid panel - VITAMIN D 25 Hydroxy (Vit-D Deficiency, Fractures)  2. Encounter for routine checking of intrauterine contraceptive device (IUD) Well with Mirena IUD which is in good position.  Time to change Mirena IUD in January 2021, patient will make an appointment.  3. H/O viral illness Patient had a viral illness early March 2020.  Not tested for COVID-19.  Will do COVID-19 antibody testing today. - SAR CoV2 Serology (COVID 19)AB(IGG)IA  Anne Haas, it was a pleasure seeing you today!  I will inform you of your results as soon as they are available. 

## 2018-07-27 NOTE — Progress Notes (Signed)
Rosemarie Galvis [facility] 1975/03/21 [id number]   History:    43 y.o. G1P0A1 Single.  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  RP:  Established patient presenting for annual gyn exam   HPI: Well on Mirena IUD x 02/2014.  No BTB.  No pelvic pain.  Abstinent.  Urine/BMs normal.  Breasts normal.  BMI 25.83.  Good fitness and healthy nutrition.  Fasting Health Labs here today.  H/O viral illness in early 04/2018.  Past medical history,surgical history, family history and social history were all reviewed and documented in the EPIC chart.  Gynecologic History No LMP recorded. (Menstrual status: IUD). Contraception: Mirena IUD x 02/2014 Last Pap: 07/2017. Results were: Negative/HPV HR neg Last mammogram: 05/2017. Results were: Negative Bone Density: Never Colonoscopy: Never  Obstetric History OB History  Gravida Para Term Preterm AB Living  1 0     1 0  SAB TAB Ectopic Multiple Live Births               # Outcome Date GA Lbr Len/2nd Weight Sex Delivery Anes PTL Lv  1 AB              ROS: A ROS was performed and pertinent positives and negatives are included in the history.  GENERAL: No fevers or chills. HEENT: No change in vision, no earache, sore throat or sinus congestion. NECK: No pain or stiffness. CARDIOVASCULAR: No chest pain or pressure. No palpitations. PULMONARY: No shortness of breath, cough or wheeze. GASTROINTESTINAL: No abdominal pain, nausea, vomiting or diarrhea, melena or bright red blood per rectum. GENITOURINARY: No urinary frequency, urgency, hesitancy or dysuria. MUSCULOSKELETAL: No joint or muscle pain, no back pain, no recent trauma. DERMATOLOGIC: No rash, no itching, no lesions. ENDOCRINE: No polyuria, polydipsia, no heat or cold intolerance. No recent change in weight. HEMATOLOGICAL: No anemia or easy bruising or bleeding. NEUROLOGIC: No headache, seizures, numbness, tingling or weakness. PSYCHIATRIC: No depression, no loss of interest in normal activity or change in sleep pattern.     Exam:    BP 130/80   Ht 5' 4.75" (1.645 m)   Wt 154 lb (69.9 kg)   BMI 25.83 kg/m   Body mass index is 25.83 kg/m.  General appearance : Well developed well nourished female. No acute distress HEENT: Eyes: no retinal hemorrhage or exudates,  Neck supple, trachea midline, no carotid bruits, no thyroidmegaly Lungs: Clear to auscultation, no rhonchi or wheezes, or rib retractions  Heart: Regular rate and rhythm, no murmurs or gallops Breast:Examined in sitting and supine position were symmetrical in appearance, no palpable masses or tenderness,  no skin retraction, no nipple inversion, no nipple discharge, no skin discoloration, no axillary or supraclavicular lymphadenopathy Abdomen: no palpable masses or tenderness, no rebound or guarding Extremities: no edema or skin discoloration or tenderness  Pelvic: Vulva: Normal             Vagina: No gross lesions or discharge  Cervix: No gross lesions or discharge.  IUD strings visible  Uterus  AV, normal size, shape and consistency, non-tender and mobile  Adnexa  Without masses or tenderness  Anus: Normal   Assessment/Plan:  43 y.o. female for annual exam   1. Well female exam with routine gynecological exam Normal gynecologic exam.  Pap test negative with negative high-risk HPV in June 2019, no indication to repeat this year.  Breast exam normal.  Screening mammogram April 2019 was negative, will repeat screening now.  Fasting health labs here today.  Good body mass  index at 25.83.  Continue with fitness and healthy nutrition. - CBC - Comp Met (CMET) - TSH - Lipid panel - VITAMIN D 25 Hydroxy (Vit-D Deficiency, Fractures)  2. Encounter for routine checking of intrauterine contraceptive device (IUD) Well with Mirena IUD which is in good position.  Time to change Mirena IUD in January 2021, patient will make an appointment.  3. H/O viral illness Patient had a viral illness early March 2020.  Not tested for COVID-19.  Will do COVID-19 antibody  testing today. - SAR CoV2 Serology (COVID 19)AB(IGG)IA  Princess Bruins MD, 9:03 AM 07/27/2018

## 2018-07-28 LAB — CBC
HCT: 41.1 % (ref 35.0–45.0)
Hemoglobin: 13.6 g/dL (ref 11.7–15.5)
MCH: 30.9 pg (ref 27.0–33.0)
MCHC: 33.1 g/dL (ref 32.0–36.0)
MCV: 93.4 fL (ref 80.0–100.0)
MPV: 10.2 fL (ref 7.5–12.5)
Platelets: 280 10*3/uL (ref 140–400)
RBC: 4.4 10*6/uL (ref 3.80–5.10)
RDW: 12 % (ref 11.0–15.0)
WBC: 5.6 10*3/uL (ref 3.8–10.8)

## 2018-07-28 LAB — COMPREHENSIVE METABOLIC PANEL
AG Ratio: 2.3 (calc) (ref 1.0–2.5)
ALT: 14 U/L (ref 6–29)
AST: 15 U/L (ref 10–30)
Albumin: 4.5 g/dL (ref 3.6–5.1)
Alkaline phosphatase (APISO): 53 U/L (ref 31–125)
BUN: 11 mg/dL (ref 7–25)
CO2: 25 mmol/L (ref 20–32)
Calcium: 9.2 mg/dL (ref 8.6–10.2)
Chloride: 103 mmol/L (ref 98–110)
Creat: 0.81 mg/dL (ref 0.50–1.10)
Globulin: 2 g/dL (calc) (ref 1.9–3.7)
Glucose, Bld: 84 mg/dL (ref 65–99)
Potassium: 4.2 mmol/L (ref 3.5–5.3)
Sodium: 139 mmol/L (ref 135–146)
Total Bilirubin: 1 mg/dL (ref 0.2–1.2)
Total Protein: 6.5 g/dL (ref 6.1–8.1)

## 2018-07-28 LAB — TSH: TSH: 1.77 mIU/L

## 2018-07-28 LAB — LIPID PANEL
Cholesterol: 201 mg/dL — ABNORMAL HIGH (ref <200)
HDL: 65 mg/dL (ref >=50)
LDL Cholesterol (Calc): 121 mg/dL (calc) — ABNORMAL HIGH
Non-HDL Cholesterol (Calc): 136 mg/dL (calc) — ABNORMAL HIGH (ref <130)
Total CHOL/HDL Ratio: 3.1 (calc) (ref <5.0)
Triglycerides: 61 mg/dL (ref <150)

## 2018-07-28 LAB — VITAMIN D 25 HYDROXY (VIT D DEFICIENCY, FRACTURES): Vit D, 25-Hydroxy: 31 ng/mL (ref 30–100)

## 2018-07-28 LAB — SAR COV2 SEROLOGY (COVID19)AB(IGG),IA: SARS CoV2 AB IGG: NEGATIVE

## 2018-08-03 ENCOUNTER — Other Ambulatory Visit: Payer: Self-pay | Admitting: Family Medicine

## 2018-08-03 ENCOUNTER — Other Ambulatory Visit: Payer: Self-pay | Admitting: Obstetrics & Gynecology

## 2018-08-03 DIAGNOSIS — Z1231 Encounter for screening mammogram for malignant neoplasm of breast: Secondary | ICD-10-CM

## 2018-08-10 ENCOUNTER — Other Ambulatory Visit: Payer: Self-pay

## 2018-08-10 ENCOUNTER — Encounter (HOSPITAL_COMMUNITY): Payer: Self-pay | Admitting: *Deleted

## 2018-08-10 ENCOUNTER — Ambulatory Visit (INDEPENDENT_AMBULATORY_CARE_PROVIDER_SITE_OTHER): Payer: No Typology Code available for payment source

## 2018-08-10 ENCOUNTER — Ambulatory Visit (HOSPITAL_COMMUNITY)
Admission: EM | Admit: 2018-08-10 | Discharge: 2018-08-10 | Disposition: A | Discharge status: Home / Self Care | Payer: No Typology Code available for payment source | Attending: Internal Medicine | Admitting: Internal Medicine

## 2018-08-10 DIAGNOSIS — S9031XA Contusion of right foot, initial encounter: Secondary | ICD-10-CM | POA: Diagnosis not present

## 2018-08-10 DIAGNOSIS — S92524A Nondisplaced fracture of medial phalanx of right lesser toe(s), initial encounter for closed fracture: Secondary | ICD-10-CM | POA: Diagnosis not present

## 2018-08-10 DIAGNOSIS — W5519XA Other contact with horse, initial encounter: Secondary | ICD-10-CM | POA: Diagnosis not present

## 2018-08-10 NOTE — ED Triage Notes (Signed)
States her horse stepped on right foot today.  C/O pain in multiple right toes into distal foot.  CMS intact.

## 2018-08-10 NOTE — ED Provider Notes (Signed)
[place]    CSN: [id number] Arrival date & time: 08/10/18  1752     History   Chief Complaint Chief Complaint  Patient presents with  . Foot Injury    HPI Anne Haas is a 43 y.o. female.   Anne Haas presents with complaints of right foot pain after her horse stepped on it earlier this afternoon. She was wearing rubber boots. States she heard popping sensation. Some tingling sensation to toes. Mild pain 4/10 in severity, shooting and aching. Hasn't taken any medications for symptoms. Denies any previous foot or toe injury. Has been ambulatory. Without contributing medical history.     ROS per HPI, negative if not otherwise mentioned.      History reviewed. No pertinent past medical history.  Patient Active Problem List   Diagnosis Date Noted  . Bite, insect 08/04/2016    History reviewed. No pertinent surgical history.  OB History    Gravida  1   Para  0   Term      Preterm      AB  1   Living  0     SAB      TAB      Ectopic      Multiple      Live Births               Home Medications    Prior to Admission medications   Medication Sig Start Date End Date Taking? Authorizing Provider  Ascorbic Acid (VITAMIN C PO) Take by mouth.   Yes [provider]  levonorgestrel (MIRENA) 20 MCG/24HR IUD 1 each by Intrauterine route once.   Yes [provider]  VITAMIN D PO Take by mouth.   Yes [provider]    Family History Family History  Problem Relation Age of Onset  . Hypertension Mother   . Heart attack Mother   . Stroke Father   . Diabetes Brother   . Cancer Maternal Grandfather        pancreatic  . Diabetes Paternal Grandfather     Social History Social History   Tobacco Use  . Smoking status: Never Smoker  . Smokeless tobacco: Never Used  Substance Use Topics  . Alcohol use: No  . Drug use: No     Allergies   Neomycin, Septra [sulfamethoxazole-trimethoprim], and Shrimp  [shellfish allergy]   Review of Systems Review of Systems   Physical Exam Triage Vital Signs ED Triage Vitals  Enc Vitals Group     BP 08/10/18 1834 121/85     Pulse Rate 08/10/18 1834 62     Resp 08/10/18 1834 16     Temp 08/10/18 1834 99.3 F (37.4 C)     Temp Source 08/10/18 1834 Oral     SpO2 08/10/18 1834 100 %     Weight --      Height --      Head Circumference --      Peak Flow --      Pain Score 08/10/18 1835 3     Pain Loc --      Pain Edu? --      Excl. in [place]? --    No data found.  Updated Vital Signs BP 121/85   Pulse 62   Temp 99.3 F (37.4 C) (Oral)   Resp 16   SpO2 100%   Visual Acuity Right Eye Distance:   Left Eye Distance:   Bilateral Distance:    Right  Eye Near:   Left Eye Near:    Bilateral Near:     Physical Exam Constitutional:      General: She is not in acute distress.    Appearance: She is well-developed.  Cardiovascular:     Rate and Rhythm: Normal rate.  Pulmonary:     Effort: Pulmonary effort is normal.  Musculoskeletal:     Right ankle: Normal.     Right foot: Normal range of motion and normal capillary refill. Tenderness, bony tenderness and swelling present. No crepitus, deformity or laceration.       Feet:     Comments: Toes 3-5 with tenderness to middle and distal phalanxes; cap refill < 2 seconds ; no MTP joint tenderness; bruising and redness developing. Ambulatory; strong pedal pulse   Skin:    General: Skin is warm and dry.  Neurological:     Mental Status: She is alert and oriented to person, place, and time.      UC Treatments / Results  Labs (all labs ordered are listed, but only abnormal results are displayed) Labs Reviewed - No data to display  EKG None  Radiology Dg Foot Complete Right  Result Date: 08/10/2018 CLINICAL DATA:  Crush injury, horse stepped on foot today. Right foot pain. EXAM: RIGHT FOOT COMPLETE - 3+ VIEW COMPARISON:  None. FINDINGS: Possible tiny chip fracture from the fourth  digit middle phalanx lateral aspect. This is only seen on a single view. No additional fracture of the foot. The alignment and joint spaces are maintained. There is a plantar calcaneal spur. Mild dorsal soft tissue edema. IMPRESSION: Possible tiny chip fracture from the fourth digit middle phalanx. No additional fracture of the foot. Electronically Signed   By: Keith Rake M.D.   On: 08/10/2018 19:23    Procedures Procedures (including critical care time)  Medications Ordered in UC Medications - No data to display  Initial Impression / Assessment and Plan / UC Course  I have reviewed the triage vital signs and the nursing notes.  Pertinent labs & imaging results that were available during my care of the patient were reviewed by me and considered in my medical decision making (see chart for details).     Probable small fracture to middle phalanx of 4th toe. Post op shoe provided. Patient declines any ibuprofen here for pain. Pain management discussed. Patient verbalized understanding and agreeable to plan.  Ambulatory out of clinic without difficulty.    Final Clinical Impressions(s) / UC Diagnoses   Final diagnoses:  Closed nondisplaced fracture of middle phalanx of lesser toe of right foot, initial encounter  Contusion of right foot, initial encounter     Discharge Instructions     Ice, elevation, antiinflammatories as needed for pain control.  May use the provided shoe for support of the toe, or buddy tape.  Follow up with podiatry as needed for persistent symptoms.     ED Prescriptions    None     Controlled Substance Prescriptions [place] Controlled Substance Registry consulted? Not Applicable   Zigmund Gottron, NP 08/10/18 1949

## 2018-08-10 NOTE — Discharge Instructions (Signed)
Ice, elevation, antiinflammatories as needed for pain control.  May use the provided shoe for support of the toe, or buddy tape.  Follow up with podiatry as needed for persistent symptoms.

## 2018-09-17 ENCOUNTER — Other Ambulatory Visit: Payer: Self-pay

## 2018-09-17 ENCOUNTER — Ambulatory Visit
Admission: RE | Admit: 2018-09-17 | Discharge: 2018-09-17 | Disposition: A | Discharge status: Home / Self Care | Payer: No Typology Code available for payment source | Source: Ambulatory Visit | Attending: Obstetrics & Gynecology | Admitting: Obstetrics & Gynecology

## 2018-09-17 DIAGNOSIS — Z1231 Encounter for screening mammogram for malignant neoplasm of breast: Secondary | ICD-10-CM

## 2018-09-18 ENCOUNTER — Other Ambulatory Visit: Payer: Self-pay | Admitting: Obstetrics & Gynecology

## 2018-09-18 DIAGNOSIS — R928 Other abnormal and inconclusive findings on diagnostic imaging of breast: Secondary | ICD-10-CM

## 2018-09-21 ENCOUNTER — Other Ambulatory Visit: Payer: Self-pay

## 2018-09-21 ENCOUNTER — Ambulatory Visit
Admission: RE | Admit: 2018-09-21 | Discharge: 2018-09-21 | Disposition: A | Discharge status: Home / Self Care | Payer: No Typology Code available for payment source | Source: Ambulatory Visit | Attending: Obstetrics & Gynecology | Admitting: Obstetrics & Gynecology

## 2018-09-21 DIAGNOSIS — R928 Other abnormal and inconclusive findings on diagnostic imaging of breast: Secondary | ICD-10-CM

## 2018-09-24 ENCOUNTER — Other Ambulatory Visit: Payer: No Typology Code available for payment source

## 2019-02-21 ENCOUNTER — Telehealth: Payer: Self-pay | Admitting: *Deleted

## 2019-02-21 NOTE — Telephone Encounter (Signed)
Late entry 02/19/19) patient called asking when last Mirena IUD was placed at [facility].[email or web address]. per paper office note on 02/27/15 scanned in epic chart. IUD was removed and reinsertion on this date. I left detailed message on patient cell with this information.

## 2019-05-23 ENCOUNTER — Other Ambulatory Visit: Payer: Self-pay | Admitting: Obstetrics & Gynecology

## 2019-06-24 ENCOUNTER — Other Ambulatory Visit: Payer: Self-pay | Admitting: Obstetrics & Gynecology

## 2019-06-24 DIAGNOSIS — Z1231 Encounter for screening mammogram for malignant neoplasm of breast: Secondary | ICD-10-CM

## 2019-07-03 ENCOUNTER — Encounter (HOSPITAL_COMMUNITY): Payer: Self-pay

## 2019-07-03 ENCOUNTER — Ambulatory Visit (INDEPENDENT_AMBULATORY_CARE_PROVIDER_SITE_OTHER): Payer: No Typology Code available for payment source

## 2019-07-03 ENCOUNTER — Ambulatory Visit (HOSPITAL_COMMUNITY)
Admission: EM | Admit: 2019-07-03 | Discharge: 2019-07-03 | Disposition: A | Discharge status: Home / Self Care | Payer: No Typology Code available for payment source | Attending: Emergency Medicine | Admitting: Emergency Medicine

## 2019-07-03 ENCOUNTER — Other Ambulatory Visit: Payer: Self-pay

## 2019-07-03 DIAGNOSIS — G8929 Other chronic pain: Secondary | ICD-10-CM | POA: Diagnosis not present

## 2019-07-03 DIAGNOSIS — M545 Low back pain: Secondary | ICD-10-CM

## 2019-07-03 MED ORDER — PREDNISONE 10 MG (21) PO TBPK: ORAL_TABLET | ORAL | 0 refills | Status: DC

## 2019-07-03 MED ORDER — CYCLOBENZAPRINE HCL 5 MG PO TABS: 5.0000 mg | ORAL_TABLET | Freq: Three times a day (TID) | ORAL | 0 refills | Status: DC | PRN

## 2019-07-03 MED ORDER — IBUPROFEN 800 MG PO TABS: 800.0000 mg | ORAL_TABLET | Freq: Three times a day (TID) | ORAL | 0 refills | Status: DC

## 2019-07-03 NOTE — ED Provider Notes (Signed)
[place]    CSN: [id number] Arrival date & time: 07/03/19  1548      History   Chief Complaint Chief Complaint  Patient presents with  . Back Pain    HPI Anne Haas is a 44 y.o. female.   Who presented to the urgent care with a complaint of chronic back pain for the past few days.  Reports she was told in the past that she has a herniated disc.  She localizes the pain to the low back.  She describes the pain as constant and achy, rated at 2 on a scale 1-10.  She has tried OTC medications without relief.  Her symptoms are made worse with ROM.  She denies similar symptoms in the past.    The history is provided by the patient. No language interpreter was used.    History reviewed. No pertinent past medical history.  Patient Active Problem List   Diagnosis Date Noted  . Bite, insect 08/04/2016    History reviewed. No pertinent surgical history.  OB History    Gravida  1   Para  0   Term      Preterm      AB  1   Living  0     SAB      TAB      Ectopic      Multiple      Live Births               Home Medications    Prior to Admission medications   Medication Sig Start Date End Date Taking? Authorizing Provider  Ascorbic Acid (VITAMIN C PO) Take by mouth.    [provider]  cyclobenzaprine (FLEXERIL) 5 MG tablet Take 1 tablet (5 mg total) by mouth 3 (three) times daily as needed for muscle spasms. 07/03/19   Lillyann Ahart, Darrelyn Hillock, FNP  ibuprofen (ADVIL) 800 MG tablet Take 1 tablet (800 mg total) by mouth 3 (three) times daily. 07/03/19   Djibril Glogowski, Darrelyn Hillock, FNP  levonorgestrel (MIRENA) 20 MCG/24HR IUD 1 each by Intrauterine route once.    [provider]  predniSONE (STERAPRED UNI-PAK 21 TAB) 10 MG (21) TBPK tablet Take 6 tabs by mouth daily  for 1 day, then 5 tabs for 1 day, then 4 tabs for 1 day, then 3 tabs for 1 day, 2 tabs for 1 days then 1 tab by mouth daily for 1 day 07/03/19   Emerson Monte, FNP  VITAMIN D PO  Take by mouth.    [provider]    Family History Family History  Problem Relation Age of Onset  . Hypertension Mother   . Heart attack Mother   . Stroke Father   . Diabetes Brother   . Cancer Maternal Grandfather        pancreatic  . Diabetes Paternal Grandfather     Social History Social History   Tobacco Use  . Smoking status: Never Smoker  . Smokeless tobacco: Never Used  Substance Use Topics  . Alcohol use: No  . Drug use: No     Allergies   Neomycin, Septra [sulfamethoxazole-trimethoprim], and Shrimp [shellfish allergy]   Review of Systems Review of Systems  Constitutional: Negative.   Respiratory: Negative.   Cardiovascular: Negative.   Musculoskeletal: Positive for back pain.  All other systems reviewed and are negative.    Physical Exam Triage Vital Signs ED Triage Vitals  Enc Vitals Group     BP 07/03/19  1615 121/63     Pulse Rate 07/03/19 1615 65     Resp 07/03/19 1615 15     Temp 07/03/19 1615 98.8 F (37.1 C)     Temp Source 07/03/19 1615 Oral     SpO2 07/03/19 1615 100 %     Weight 07/03/19 1620 165 lb (74.8 kg)     Height --      Head Circumference --      Peak Flow --      Pain Score 07/03/19 1618 1     Pain Loc --      Pain Edu? --      Excl. in [place]? --    No data found.  Updated Vital Signs BP 121/63 (BP Location: Right Arm)   Pulse 65   Temp 98.8 F (37.1 C) (Oral)   Resp 15   Wt 165 lb (74.8 kg)   SpO2 100%   BMI 27.67 kg/m   Visual Acuity Right Eye Distance:   Left Eye Distance:   Bilateral Distance:    Right Eye Near:   Left Eye Near:    Bilateral Near:     Physical Exam Vitals and nursing note reviewed.  Constitutional:      General: She is not in acute distress.    Appearance: Normal appearance. She is normal weight. She is not ill-appearing, toxic-appearing or diaphoretic.  Cardiovascular:     Rate and Rhythm: Normal rate and regular rhythm.     Pulses: Normal pulses.     Heart sounds: Normal  heart sounds. No murmur. No friction rub. No gallop.   Pulmonary:     Effort: Pulmonary effort is normal. No respiratory distress.     Breath sounds: Normal breath sounds. No stridor. No wheezing, rhonchi or rales.  Chest:     Chest wall: No tenderness.  Musculoskeletal:     Lumbar back: Spasms and tenderness present.     Comments: Back:  Patient ambulates from chair to exam table without difficulty.  Inspection: Skin clear and intact without obvious swelling, erythema, or ecchymosis. Warm to the touch  Palpation: Vertebral processes nontender. Tenderness about the lower back Special Tests: Negative Straight leg raise  Neurological:     General: No focal deficit present.     Mental Status: She is alert and oriented to person, place, and time.     Cranial Nerves: No cranial nerve deficit.     Sensory: No sensory deficit.     Motor: No weakness.     Coordination: Coordination normal.      UC Treatments / Results  Labs (all labs ordered are listed, but only abnormal results are displayed) Labs Reviewed - No data to display  EKG   Radiology DG Lumbar Spine Complete  Result Date: 07/03/2019 CLINICAL DATA:  Chronic back pain for 10 years, history of 2 herniated discs, pain comes and goes, sometime with LEFT hip and LEFT leg pain, no recent injury EXAM: LUMBAR SPINE - COMPLETE 4+ VIEW COMPARISON:  02/20/2012 FINDINGS: 5 non-rib-bearing lumbar vertebra. Vertebral body and disc space heights maintained. No fracture, subluxation or bone destruction. SI joints preserved. Osseous mineralization normal for technique. IUD projects over pelvis. IMPRESSION: Normal exam. Electronically Signed   By: Lavonia Dana M.D.   On: 07/03/2019 18:38    Procedures Procedures (including critical care time)  Medications Ordered in UC Medications - No data to display  Initial Impression / Assessment and Plan / UC Course  I have reviewed the triage vital  signs and the nursing notes.  Pertinent labs &  imaging results that were available during my care of the patient were reviewed by me and considered in my medical decision making (see chart for details).   Patient is stable at discharge.  X-ray is negative for bony abnormality including fracture or dislocation.  I have reviewed the x-ray myself and the radiologist interpretation.  I am in agreement with the radiologist interpretation.  Flexeril, prednisone taper and ibuprofen were prescribed.  Was advised to follow-up with PCP.  Final Clinical Impressions(s) / UC Diagnoses   Final diagnoses:  Chronic bilateral low back pain without sciatica     Discharge Instructions     Rest, ice and heat as needed Ensure adequate ROM as tolerated. Prescribed ibuprofen as needed for  pain relief Prescribed flexeril  for muscle spasm.  Do not drive or operate heavy machinery while taking this medication Return here or go to ER if you have any new or worsening symptoms such as numbness/tingling of the inner thighs, loss of bladder or bowel control, headache/blurry vision, nausea/vomiting, confusion/altered mental status, dizziness, weakness, passing out, imbalance, etc...      ED Prescriptions    Medication Sig Dispense Auth. Provider   predniSONE (STERAPRED UNI-PAK 21 TAB) 10 MG (21) TBPK tablet Take 6 tabs by mouth daily  for 1 day, then 5 tabs for 1 day, then 4 tabs for 1 day, then 3 tabs for 1 day, 2 tabs for 1 days then 1 tab by mouth daily for 1 day 21 tablet Gideon Burstein, Darrelyn Hillock, FNP   cyclobenzaprine (FLEXERIL) 5 MG tablet Take 1 tablet (5 mg total) by mouth 3 (three) times daily as needed for muscle spasms. 30 tablet Donicia Druck S, FNP   ibuprofen (ADVIL) 800 MG tablet Take 1 tablet (800 mg total) by mouth 3 (three) times daily. 21 tablet Castin Donaghue, Darrelyn Hillock, FNP     PDMP not reviewed this encounter.   Emerson Monte, FNP 07/03/19 1841

## 2019-07-03 NOTE — Discharge Instructions (Addendum)
Rest, ice and heat as needed Ensure adequate ROM as tolerated. Prescribed ibuprofen as needed for  pain relief Prescribed flexeril  for muscle spasm.  Do not drive or operate heavy machinery while taking this medication Return here or go to ER if you have any new or worsening symptoms such as numbness/tingling of the inner thighs, loss of bladder or bowel control, headache/blurry vision, nausea/vomiting, confusion/altered mental status, dizziness, weakness, passing out, imbalance, etc..Marland Kitchen

## 2019-07-03 NOTE — ED Triage Notes (Signed)
Pt states she has lower back pain , this has been on going for 10 years now.

## 2019-07-25 ENCOUNTER — Other Ambulatory Visit: Payer: Self-pay

## 2019-07-26 ENCOUNTER — Other Ambulatory Visit: Payer: Self-pay | Admitting: Family Medicine

## 2019-07-26 ENCOUNTER — Ambulatory Visit (INDEPENDENT_AMBULATORY_CARE_PROVIDER_SITE_OTHER): Payer: No Typology Code available for payment source | Admitting: Obstetrics & Gynecology

## 2019-07-26 ENCOUNTER — Encounter: Payer: Self-pay | Admitting: Obstetrics & Gynecology

## 2019-07-26 VITALS — BP 126/78

## 2019-07-26 DIAGNOSIS — Z30433 Encounter for removal and reinsertion of intrauterine contraceptive device: Secondary | ICD-10-CM

## 2019-07-26 DIAGNOSIS — M545 Low back pain, unspecified: Secondary | ICD-10-CM

## 2019-07-26 NOTE — Patient Instructions (Signed)
1. Encounter for IUD removal and reinsertion Easy removal of Mirena IUD.  IUD complete and intact.  Easy insertion of new Mirena IUD without complication.  Well-tolerated by patient.  Postprocedure precautions reviewed.  We will follow-up in 1 week for IUD check and annual gynecologic exam at the same time.  Anne Haas, it was a pleasure seeing you today!

## 2019-07-26 NOTE — Progress Notes (Signed)
    Anne Haas 10/09/1975 [id number]        44 y.o.  G1P0010 Single  RP: Mirena IUD removal and insertion of a new Mirena IUD  HPI: Mirena IUD x 02/2014.  Abstinent.  No pelvic pain.  No BTB.  Normal vaginal secretions.   OB History  Gravida Para Term Preterm AB Living  1 0     1 0  SAB TAB Ectopic Multiple Live Births               # Outcome Date GA Lbr Len/2nd Weight Sex Delivery Anes PTL Lv  1 AB             Past medical history,surgical history, problem list, medications, allergies, family history and social history were all reviewed and documented in the EPIC chart.   Directed ROS with pertinent positives and negatives documented in the history of present illness/assessment and plan.  Exam:  Vitals:   07/26/19 0954  BP: 126/78   General appearance:  Normal                                                                    IUD procedure note       Patient presented to the office today for removal and placement of Mirena IUD. The patient had previously been provided with literature information on this method of contraception. The risks benefits and pros and cons were discussed and all her questions were answered. She is fully aware that this form of contraception is 99% effective and is good for 5 years.  Pelvic exam: Vulva normal Vagina: No lesions or discharge Cervix: No lesions or discharge.  Easy removal of IUD by pulling on strings.  IUD intact, complete.   Uterus: AV position Adnexa: No masses or tenderness Rectal exam: Not done  The cervix was cleansed with Betadine solution. Hurricane spray on the cervix.  A single-tooth tenaculum was placed on the anterior cervical lip. The IUD was shown to the patient and inserted in a sterile fashion.  Hysterometry with Os finder at 7 cm.  The IUD was inserted easily.  The IUD string was trimmed. The single-tooth tenaculum was removed. Patient was instructed to return back to the office in one week for follow up and  Annual/Gyn visit.        Assessment/Plan:  44 y.o. G1P0010   1. Encounter for IUD removal and reinsertion Easy removal of Mirena IUD.  IUD complete and intact.  Easy insertion of new Mirena IUD without complication.  Well-tolerated by patient.  Postprocedure precautions reviewed.  We will follow-up in 1 week for IUD check and annual gynecologic exam at the same time.   Princess Bruins MD, 10:24 AM 07/26/2019

## 2019-07-29 ENCOUNTER — Other Ambulatory Visit: Payer: Self-pay

## 2019-07-30 ENCOUNTER — Encounter: Payer: Self-pay | Admitting: Obstetrics & Gynecology

## 2019-07-30 ENCOUNTER — Ambulatory Visit (INDEPENDENT_AMBULATORY_CARE_PROVIDER_SITE_OTHER): Payer: No Typology Code available for payment source | Admitting: Obstetrics & Gynecology

## 2019-07-30 VITALS — BP 126/80 | Ht 64.5 in | Wt 168.6 lb

## 2019-07-30 DIAGNOSIS — E663 Overweight: Secondary | ICD-10-CM

## 2019-07-30 DIAGNOSIS — Z30431 Encounter for routine checking of intrauterine contraceptive device: Secondary | ICD-10-CM

## 2019-07-30 DIAGNOSIS — Z01419 Encounter for gynecological examination (general) (routine) without abnormal findings: Secondary | ICD-10-CM | POA: Diagnosis not present

## 2019-07-30 NOTE — Progress Notes (Signed)
Anne Haas 23-Jan-1976 [id number]   History:    44 y.o. G1P0A1 Single  RP:  Established patient presenting for annual gyn exam   HPI: Well on new Mirena IUD inserted 07/26/2019.  No pelvic pain.  No longer having vaginal bleeding, just brownish.  Abstinent x last Pap in 07/2017 which was negative/HPV HR neg.  Breasts normal.  Urine/BMs normal.  BMI increased to 28.49.  Will restart fitness and lower calorie nutrition.  Health labs with Fam MD recently.  LDL increased, cutting on bad cholesterol.  Past medical history,surgical history, family history and social history were all reviewed and documented in the EPIC chart.  Gynecologic History No LMP recorded. (Menstrual status: IUD).  Obstetric History OB History  Gravida Para Term Preterm AB Living  1 0     1 0  SAB TAB Ectopic Multiple Live Births               # Outcome Date GA Lbr Len/2nd Weight Sex Delivery Anes PTL Lv  1 AB              ROS: A ROS was performed and pertinent positives and negatives are included in the history.  GENERAL: No fevers or chills. HEENT: No change in vision, no earache, sore throat or sinus congestion. NECK: No pain or stiffness. CARDIOVASCULAR: No chest pain or pressure. No palpitations. PULMONARY: No shortness of breath, cough or wheeze. GASTROINTESTINAL: No abdominal pain, nausea, vomiting or diarrhea, melena or bright red blood per rectum. GENITOURINARY: No urinary frequency, urgency, hesitancy or dysuria. MUSCULOSKELETAL: No joint or muscle pain, no back pain, no recent trauma. DERMATOLOGIC: No rash, no itching, no lesions. ENDOCRINE: No polyuria, polydipsia, no heat or cold intolerance. No recent change in weight. HEMATOLOGICAL: No anemia or easy bruising or bleeding. NEUROLOGIC: No headache, seizures, numbness, tingling or weakness. PSYCHIATRIC: No depression, no loss of interest in normal activity or change in sleep pattern.     Exam:   BP 126/80   Ht 5' 4.5" (1.638 m)   Wt 168 lb 9.6 oz  (76.5 kg)   BMI 28.49 kg/m   Body mass index is 28.49 kg/m.  General appearance : Well developed well nourished female. No acute distress HEENT: Eyes: no retinal hemorrhage or exudates,  Neck supple, trachea midline, no carotid bruits, no thyroidmegaly Lungs: Clear to auscultation, no rhonchi or wheezes, or rib retractions  Heart: Regular rate and rhythm, no murmurs or gallops Breast:Examined in sitting and supine position were symmetrical in appearance, no palpable masses or tenderness,  no skin retraction, no nipple inversion, no nipple discharge, no skin discoloration, no axillary or supraclavicular lymphadenopathy Abdomen: no palpable masses or tenderness, no rebound or guarding Extremities: no edema or skin discoloration or tenderness  Pelvic: Vulva: Normal             Vagina: No gross lesions or discharge  Cervix: No gross lesions or discharge.  IUD strings visible.  No erythema at cervix.  No vaginal blood.    Uterus  AV, normal size, shape and consistency, non-tender and mobile  Adnexa  Without masses or tenderness  Anus: Normal   Assessment/Plan:  44 y.o. female for annual exam   1. Well female exam with routine gynecological exam Normal gynecologic exam.  Pap test June 2019 was negative with negative high-risk HPV.  Abstinent since then.  We will repeat a Pap test at 3 years next year.  Breast exam normal.  Screening mammogram is scheduled July 2021.  Recent health labs within normal except for increased LDL.  On the lower bad cholesterol diet now.  2. Encounter for routine checking of intrauterine contraceptive device (IUD) New Mirena IUD recently inserted on July 26, 2019.  IUD well-tolerated, in good position with no sign of infection.  3. Overweight (BMI 25.0-29.9) Increase body mass index to 28.49.  Recommend a lower calorie/carb diet such as [employer].  Aerobic activities 5 times a week and light weightlifting every 2 days.  Princess Bruins MD, 8:28 AM  07/30/2019

## 2019-07-30 NOTE — Patient Instructions (Signed)
1. Well female exam with routine gynecological exam Normal gynecologic exam.  Pap test June 2019 was negative with negative high-risk HPV.  Abstinent since then.  We will repeat a Pap test at 3 years next year.  Breast exam normal.  Screening mammogram is scheduled July 2021.  Recent health labs within normal except for increased LDL.  On the lower bad cholesterol diet now.  2. Encounter for routine checking of intrauterine contraceptive device (IUD) New Mirena IUD recently inserted on July 26, 2019.  IUD well-tolerated, in good position with no sign of infection.  3. Overweight (BMI 25.0-29.9) Increase body mass index to 28.49.  Recommend a lower calorie/carb diet such as [employer].  Aerobic activities 5 times a week and light weightlifting every 2 days.  Anne Haas, it was a pleasure seeing you today!

## 2019-08-28 ENCOUNTER — Other Ambulatory Visit: Payer: Self-pay

## 2019-08-28 ENCOUNTER — Emergency Department (HOSPITAL_COMMUNITY)
Admission: EM | Admit: 2019-08-28 | Discharge: 2019-08-28 | Disposition: A | Discharge status: Home / Self Care | Payer: No Typology Code available for payment source | Attending: Emergency Medicine | Admitting: Emergency Medicine

## 2019-08-28 ENCOUNTER — Encounter (HOSPITAL_COMMUNITY): Payer: Self-pay | Admitting: Emergency Medicine

## 2019-08-28 ENCOUNTER — Ambulatory Visit
Admission: RE | Admit: 2019-08-28 | Discharge: 2019-08-28 | Disposition: A | Discharge status: Home / Self Care | Payer: No Typology Code available for payment source | Source: Ambulatory Visit | Attending: Family Medicine | Admitting: Family Medicine

## 2019-08-28 ENCOUNTER — Emergency Department (HOSPITAL_COMMUNITY): Payer: No Typology Code available for payment source

## 2019-08-28 DIAGNOSIS — Y999 Unspecified external cause status: Secondary | ICD-10-CM | POA: Insufficient documentation

## 2019-08-28 DIAGNOSIS — Y929 Unspecified place or not applicable: Secondary | ICD-10-CM | POA: Diagnosis not present

## 2019-08-28 DIAGNOSIS — S0993XA Unspecified injury of face, initial encounter: Secondary | ICD-10-CM | POA: Diagnosis present

## 2019-08-28 DIAGNOSIS — Y939 Activity, unspecified: Secondary | ICD-10-CM | POA: Insufficient documentation

## 2019-08-28 DIAGNOSIS — W228XXA Striking against or struck by other objects, initial encounter: Secondary | ICD-10-CM | POA: Insufficient documentation

## 2019-08-28 DIAGNOSIS — Z23 Encounter for immunization: Secondary | ICD-10-CM | POA: Diagnosis not present

## 2019-08-28 DIAGNOSIS — S022XXA Fracture of nasal bones, initial encounter for closed fracture: Secondary | ICD-10-CM | POA: Insufficient documentation

## 2019-08-28 DIAGNOSIS — M545 Low back pain, unspecified: Secondary | ICD-10-CM

## 2019-08-28 MED ORDER — TETANUS-DIPHTH-ACELL PERTUSSIS 5-2.5-18.5 LF-MCG/0.5 IM SUSP
0.5000 mL | Freq: Once | INTRAMUSCULAR | Status: AC
  Administered 2019-08-28: 0.5 mL via INTRAMUSCULAR
  Filled 2019-08-28: qty 0.5

## 2019-08-28 NOTE — Discharge Instructions (Signed)
Please read the attachment on nasal fracture.  Please try to avoid blowing the nose.  You will need to follow-up with ENT in the next 6 to 10 days.  Please first follow-up with your primary care provider regarding today's encounter, Dr. Orland Mustard.  Continue take NSAIDs or Tylenol as needed for symptoms of discomfort.  Return to the ED or seek immediate medical attention should experience any new or worsening symptoms.

## 2019-08-28 NOTE — ED Provider Notes (Signed)
[facility] DEPT Provider Note   CSN: [id number] Arrival date & time: 08/28/19  1058     History Chief Complaint  Patient presents with  . Facial Pain    Anne Haas is a 44 y.o. female with no relevant past medical history presents to the ED after sustaining injury to her face.  Patient reports that she had her horse tied to the top board on her fen noticed throat appreciated to feel "not ce when the horse took off, causing the board to fly off the fence and strike her in the face.  She complains of pain and swelling involving her right cheek.  She denies any LOC or memory disturbance.  She also denies any visual deficits, blood thinners, difficulty opening her mouth, headache or dizziness, weakness or numbness, difficulty swallowing or breathing, neck pain, or other injury.  She has minor abrasions to her face and is also concerned for possible broken nose.  She is uncertain as to her last tetanus vaccine.  Medication  HPI     History reviewed. No pertinent past medical history.  Patient Active Problem List   Diagnosis Date Noted  . Bite, insect 08/04/2016    History reviewed. No pertinent surgical history.   OB History    Gravida  1   Para  0   Term      Preterm      AB  1   Living  0     SAB      TAB      Ectopic      Multiple      Live Births              Family History  Problem Relation Age of Onset  . Hypertension Mother   . Heart attack Mother   . Stroke Father   . Diabetes Brother   . Cancer Maternal Grandfather        pancreatic  . Diabetes Paternal Grandfather     Social History   Tobacco Use  . Smoking status: Never Smoker  . Smokeless tobacco: Never Used  Vaping Use  . Vaping Use: Never used  Substance Use Topics  . Alcohol use: No  . Drug use: No    Home Medications Prior to Admission medications   Medication Sig Start Date End Date Taking? Authorizing Provider  ibuprofen (ADVIL) 800 MG  tablet Take 1 tablet (800 mg total) by mouth 3 (three) times daily. 07/03/19   Avegno, Darrelyn Hillock, FNP  levonorgestrel (MIRENA) 20 MCG/24HR IUD 1 each by Intrauterine route once.    [provider]    Allergies    Neomycin, Septra [sulfamethoxazole-trimethoprim], and Shrimp [shellfish allergy]  Review of Systems   Review of Systems  HENT: Positive for facial swelling and nosebleeds. Negative for dental problem, drooling and trouble swallowing.   Respiratory: Negative for shortness of breath.   Cardiovascular: Negative for chest pain.    Physical Exam Updated Vital Signs BP 125/77   Pulse 64   Temp 98.4 F (36.9 C) (Oral)   Resp 19   SpO2 97%   Physical Exam Vitals and nursing note reviewed. Exam conducted with a chaperone present.  Constitutional:      Appearance: Normal appearance.  HENT:     Head:     Comments: No palpable skull defects.    Nose:     Comments: Blood in nares bilaterally.  No gross deformity to the nose.  Patient is able to  breathe through each nares independently.  No obvious septal hematoma.  Significant swelling from right maxillary region and involving nose.     Mouth/Throat:     Comments: No trismus.  Patent oropharynx.  No blood in posterior oropharynx.  No obvious dental injury. Eyes:     Comments: Right eye: Significant periorbital swelling and ecchymoses.  No hyphema.  No pupillary defect.  PERRL and EOM intact.  No pain with EOMs.  No entrapment. Left eye: Normal.  Pulmonary:     Effort: Pulmonary effort is normal.  Skin:    General: Skin is dry.     Capillary Refill: Capillary refill takes less than 2 seconds.  Neurological:     Mental Status: She is alert.     GCS: GCS eye subscore is 4. GCS verbal subscore is 5. GCS motor subscore is 6.     Comments: CN II through XII grossly intact.  Can ambulate without ataxia or difficulty.  Can move all extremities.  Sensation intact throughout.  Alert and oriented x3.  Psychiatric:        Mood  and Affect: Mood normal.        Behavior: Behavior normal.        Thought Content: Thought content normal.       ED Results / Procedures / Treatments   Labs (all labs ordered are listed, but only abnormal results are displayed) Labs Reviewed - No data to display  EKG None  Radiology CT Head Wo Contrast  Result Date: 08/28/2019 CLINICAL DATA:  Facial trauma. EXAM: CT HEAD WITHOUT CONTRAST CT MAXILLOFACIAL WITHOUT CONTRAST TECHNIQUE: Multidetector CT imaging of the head and maxillofacial structures were performed using the standard protocol without intravenous contrast. Multiplanar CT image reconstructions of the maxillofacial structures were also generated. COMPARISON:  None. FINDINGS: CT HEAD FINDINGS Brain: No evidence of acute infarction, hemorrhage, hydrocephalus, extra-axial collection or mass lesion/mass effect. Vascular: No hyperdense vessel or unexpected calcification. Skull: Normal. Negative for fracture or focal lesion. Other: None. CT MAXILLOFACIAL FINDINGS Osseous: Comminuted nasal bone fracture is noted which is mildly displaced to the left. Orbits: Negative. No traumatic or inflammatory finding. Sinuses: Clear. Soft tissues: Contusion is seen involving soft tissues overlying right maxillary region. IMPRESSION: 1. Normal head CT. 2. Comminuted nasal bone fracture is noted which is mildly displaced to the left. Contusion is seen involving soft tissues overlying right maxillary region. Electronically Signed   By: Marijo Conception M.D.   On: 08/28/2019 12:47   CT Maxillofacial Wo Contrast  Result Date: 08/28/2019 CLINICAL DATA:  Facial trauma. EXAM: CT HEAD WITHOUT CONTRAST CT MAXILLOFACIAL WITHOUT CONTRAST TECHNIQUE: Multidetector CT imaging of the head and maxillofacial structures were performed using the standard protocol without intravenous contrast. Multiplanar CT image reconstructions of the maxillofacial structures were also generated. COMPARISON:  None. FINDINGS: CT HEAD FINDINGS  Brain: No evidence of acute infarction, hemorrhage, hydrocephalus, extra-axial collection or mass lesion/mass effect. Vascular: No hyperdense vessel or unexpected calcification. Skull: Normal. Negative for fracture or focal lesion. Other: None. CT MAXILLOFACIAL FINDINGS Osseous: Comminuted nasal bone fracture is noted which is mildly displaced to the left. Orbits: Negative. No traumatic or inflammatory finding. Sinuses: Clear. Soft tissues: Contusion is seen involving soft tissues overlying right maxillary region. IMPRESSION: 1. Normal head CT. 2. Comminuted nasal bone fracture is noted which is mildly displaced to the left. Contusion is seen involving soft tissues overlying right maxillary region. Electronically Signed   By: Marijo Conception M.D.   On: 08/28/2019 12:47  Procedures Procedures (including critical care time)  Medications Ordered in ED Medications  Tdap (BOOSTRIX) injection 0.5 mL (has no administration in time range)    ED Course  I have reviewed the triage vital signs and the nursing notes.  Pertinent labs & imaging results that were available during my care of the patient were reviewed by me and considered in my medical decision making (see chart for details).    MDM Rules/Calculators/A&P                          Given patient's facial injury, will obtain CT head and CT maxillofacial without contrast.  Concern for orbital for fracture given significant periorbital swelling and ecchymoses.  No entrapment.  No obvious eye injury.  Visual acuity intact.  Will update tetanus.  Patient declined any analgesics here in the ED.  Imaging CT head is personally reviewed and negative for acute intracranial abnormalities.  CT maxillofacial demonstrates comminuted nasal bone fracture with mild displacement to the left.  There are also a significant contusion in soft tissue over right maxillary region, however no evidence of orbital floor fracture.    Instructed patient not to blow her  nose.  Nares are patent bilaterally she is able to demonstrate that she can breathe through each nare when the other is occluded.  Discussed case with Dr. Roderic Palau and will place on Keflex and refer to ENT.  Encouraged patient to be seen by ENT in the next 6 to 10 days.  I also would like for her to follow-up with her primary care provider regarding today's encounter.  She declines any home analgesics in addition to her NSAIDs and Tylenol.   All of the evaluation and work-up results were discussed with the patient and any family at bedside.  Patient and/or family were informed that while patient is appropriate for discharge at this time, some medical emergencies may only develop or become detectable after a period of time.  I specifically instructed patient and/or family to return to return to the ED or seek immediate medical attention for any new or worsening symptoms.  They were provided opportunity to ask any additional questions and have none at this time.  Prior to discharge patient is feeling well, agreeable with plan for discharge home.  They have expressed understanding of verbal discharge instructions as well as return precautions and are agreeable to the plan.    Final Clinical Impression(s) / ED Diagnoses Final diagnoses:  Closed fracture of nasal bone, initial encounter    Rx / DC Orders ED Discharge Orders    None       Corena Herter, PA-C 08/28/19 1318    Milton Ferguson, MD 08/29/19 1021

## 2019-08-28 NOTE — ED Triage Notes (Signed)
Per EMS, patient from home, reports she had horse tied to wooden fence, states horse got spooked and pulled wood off of fence hitting patient in the face. Bleeding from nose upon EMS arrival, controlled now. Bruising and swelling to right cheek. Denies LOC and taking blood thinners. Denies neck and back pain.

## 2019-08-28 NOTE — ED Notes (Signed)
Patient transported to CT 

## 2019-09-17 ENCOUNTER — Encounter: Payer: Self-pay | Admitting: Anesthesiology

## 2019-09-18 ENCOUNTER — Other Ambulatory Visit: Payer: Self-pay

## 2019-09-18 ENCOUNTER — Ambulatory Visit
Admission: RE | Admit: 2019-09-18 | Discharge: 2019-09-18 | Disposition: A | Discharge status: Home / Self Care | Payer: No Typology Code available for payment source | Source: Ambulatory Visit | Attending: Obstetrics & Gynecology | Admitting: Obstetrics & Gynecology

## 2019-09-18 DIAGNOSIS — Z1231 Encounter for screening mammogram for malignant neoplasm of breast: Secondary | ICD-10-CM

## 2020-03-03 ENCOUNTER — Telehealth: Payer: No Typology Code available for payment source | Admitting: Nurse Practitioner

## 2020-03-03 DIAGNOSIS — J00 Acute nasopharyngitis [common cold]: Secondary | ICD-10-CM | POA: Diagnosis not present

## 2020-03-03 MED ORDER — BENZONATATE 100 MG PO CAPS: 100.0000 mg | ORAL_CAPSULE | Freq: Three times a day (TID) | ORAL | 0 refills | Status: DC | PRN

## 2020-03-03 MED ORDER — FLUTICASONE PROPIONATE 50 MCG/ACT NA SUSP: 2.0000 | Freq: Every day | NASAL | 6 refills | Status: DC

## 2020-03-03 NOTE — Progress Notes (Signed)

## 2020-07-30 ENCOUNTER — Other Ambulatory Visit (HOSPITAL_COMMUNITY)
Admission: RE | Admit: 2020-07-30 | Discharge: 2020-07-30 | Disposition: A | Discharge status: Home / Self Care | Payer: No Typology Code available for payment source | Source: Ambulatory Visit | Attending: Obstetrics & Gynecology | Admitting: Obstetrics & Gynecology

## 2020-07-30 ENCOUNTER — Ambulatory Visit (INDEPENDENT_AMBULATORY_CARE_PROVIDER_SITE_OTHER): Payer: No Typology Code available for payment source | Admitting: Obstetrics & Gynecology

## 2020-07-30 ENCOUNTER — Other Ambulatory Visit: Payer: Self-pay

## 2020-07-30 ENCOUNTER — Encounter: Payer: Self-pay | Admitting: Obstetrics & Gynecology

## 2020-07-30 VITALS — BP 126/78 | Ht 64.25 in | Wt 174.0 lb

## 2020-07-30 DIAGNOSIS — Z01419 Encounter for gynecological examination (general) (routine) without abnormal findings: Secondary | ICD-10-CM | POA: Diagnosis present

## 2020-07-30 DIAGNOSIS — E663 Overweight: Secondary | ICD-10-CM

## 2020-07-30 DIAGNOSIS — Z30431 Encounter for routine checking of intrauterine contraceptive device: Secondary | ICD-10-CM

## 2020-07-30 NOTE — Progress Notes (Signed)
Anne Haas 1975-12-08 [id number]   History:    45 y.o. G1P0A1 Single   RP:  Established patient presenting for annual gyn exam   HPI: Well on Mirena IUD inserted 07/26/2019.  No pelvic pain. No BTB.  Abstinent x last Pap in 07/2017 which was negative/HPV HR neg.  Breasts normal.  Urine/BMs normal.  BMI increased to 29.64.  Will restart fitness and lower calorie nutrition.  Health labs with Fam MD recently.   Past medical history,surgical history, family history and social history were all reviewed and documented in the EPIC chart.  Gynecologic History No LMP recorded. (Menstrual status: IUD).  Obstetric History OB History  Gravida Para Term Preterm AB Living  1 0     1 0  SAB IAB Ectopic Multiple Live Births               # Outcome Date GA Lbr Len/2nd Weight Sex Delivery Anes PTL Lv  1 AB              ROS: A ROS was performed and pertinent positives and negatives are included in the history.  GENERAL: No fevers or chills. HEENT: No change in vision, no earache, sore throat or sinus congestion. NECK: No pain or stiffness. CARDIOVASCULAR: No chest pain or pressure. No palpitations. PULMONARY: No shortness of breath, cough or wheeze. GASTROINTESTINAL: No abdominal pain, nausea, vomiting or diarrhea, melena or bright red blood per rectum. GENITOURINARY: No urinary frequency, urgency, hesitancy or dysuria. MUSCULOSKELETAL: No joint or muscle pain, no back pain, no recent trauma. DERMATOLOGIC: No rash, no itching, no lesions. ENDOCRINE: No polyuria, polydipsia, no heat or cold intolerance. No recent change in weight. HEMATOLOGICAL: No anemia or easy bruising or bleeding. NEUROLOGIC: No headache, seizures, numbness, tingling or weakness. PSYCHIATRIC: No depression, no loss of interest in normal activity or change in sleep pattern.     Exam:   BP 126/78   Ht 5' 4.25" (1.632 m)   Wt 174 lb (78.9 kg)   BMI 29.64 kg/m   Body mass index is 29.64 kg/m.  General appearance : Well  developed well nourished female. No acute distress HEENT: Eyes: no retinal hemorrhage or exudates,  Neck supple, trachea midline, no carotid bruits, no thyroidmegaly Lungs: Clear to auscultation, no rhonchi or wheezes, or rib retractions  Heart: Regular rate and rhythm, no murmurs or gallops Breast:Examined in sitting and supine position were symmetrical in appearance, no palpable masses or tenderness,  no skin retraction, no nipple inversion, no nipple discharge, no skin discoloration, no axillary or supraclavicular lymphadenopathy Abdomen: no palpable masses or tenderness, no rebound or guarding Extremities: no edema or skin discoloration or tenderness  Pelvic: Vulva: Normal             Vagina: No gross lesions or discharge  Cervix: No gross lesions or discharge.  [facility]UD strings visible at [facility].  Pap reflex done.  Uterus  AV, normal size, shape and consistency, non-tender and mobile  Adnexa  Without masses or tenderness  Anus: Normal   Assessment/Plan:  45 y.o. female for annual exam   1. Encounter for routine gynecological examination with Papanicolaou smear of cervix Normal gynecologic exam.  Pap reflex done.  Breast exam normal.  Patient will schedule a screening mammogram for early August 2022.  Fasting health labs here today. - CBC - Comp Met (CMET) - TSH - Lipid Profile - Vitamin D 1,25 dihydroxy - Cytology - PAP( [place])  2. Encounter for routine checking of  intrauterine contraceptive device (IUD) Well on Mirena IUD since June 2021.  No complication.  IUD in good location.  3. Overweight (BMI 25.0-29.9)  Recommend a lower calorie/carb diet.  Continue to be physically active with horseback riding.  Aerobic activities 5 times a week and light weightlifting every 2 days recommended.  Princess Bruins MD, 8:19 AM 07/30/2020

## 2020-08-03 LAB — COMPREHENSIVE METABOLIC PANEL
AG Ratio: 1.9 (calc) (ref 1.0–2.5)
ALT: 10 U/L (ref 6–29)
AST: 13 U/L (ref 10–30)
Albumin: 4.4 g/dL (ref 3.6–5.1)
Alkaline phosphatase (APISO): 56 U/L (ref 31–125)
BUN: 11 mg/dL (ref 7–25)
CO2: 28 mmol/L (ref 20–32)
Calcium: 9.4 mg/dL (ref 8.6–10.2)
Chloride: 105 mmol/L (ref 98–110)
Creat: 0.76 mg/dL (ref 0.50–1.10)
Globulin: 2.3 g/dL (calc) (ref 1.9–3.7)
Glucose, Bld: 84 mg/dL (ref 65–99)
Potassium: 4.1 mmol/L (ref 3.5–5.3)
Sodium: 139 mmol/L (ref 135–146)
Total Bilirubin: 0.7 mg/dL (ref 0.2–1.2)
Total Protein: 6.7 g/dL (ref 6.1–8.1)

## 2020-08-03 LAB — CBC
HCT: 40.4 % (ref 35.0–45.0)
Hemoglobin: 13.2 g/dL (ref 11.7–15.5)
MCH: 30.3 pg (ref 27.0–33.0)
MCHC: 32.7 g/dL (ref 32.0–36.0)
MCV: 92.9 fL (ref 80.0–100.0)
MPV: 10 fL (ref 7.5–12.5)
Platelets: 303 10*3/uL (ref 140–400)
RBC: 4.35 10*6/uL (ref 3.80–5.10)
RDW: 12 % (ref 11.0–15.0)
WBC: 5.8 10*3/uL (ref 3.8–10.8)

## 2020-08-03 LAB — LIPID PANEL
Cholesterol: 217 mg/dL — ABNORMAL HIGH (ref <200)
HDL: 55 mg/dL (ref >=50)
LDL Cholesterol (Calc): 146 mg/dL (calc) — ABNORMAL HIGH
Non-HDL Cholesterol (Calc): 162 mg/dL (calc) — ABNORMAL HIGH (ref <130)
Total CHOL/HDL Ratio: 3.9 (calc) (ref <5.0)
Triglycerides: 67 mg/dL (ref <150)

## 2020-08-03 LAB — CYTOLOGY - PAP
Comment: NEGATIVE
Diagnosis: NEGATIVE
High risk HPV: NEGATIVE

## 2020-08-03 LAB — VITAMIN D 1,25 DIHYDROXY
Vitamin D 1, 25 (OH)2 Total: 26 pg/mL (ref 18–72)
Vitamin D2 1, 25 (OH)2: <8 pg/mL
Vitamin D3 1, 25 (OH)2: 26 pg/mL

## 2020-08-03 LAB — TSH: TSH: 2.27 mIU/L

## 2020-09-15 ENCOUNTER — Other Ambulatory Visit: Payer: Self-pay | Admitting: Obstetrics & Gynecology

## 2020-09-15 DIAGNOSIS — Z1231 Encounter for screening mammogram for malignant neoplasm of breast: Secondary | ICD-10-CM

## 2020-09-22 ENCOUNTER — Ambulatory Visit
Admission: RE | Admit: 2020-09-22 | Discharge: 2020-09-22 | Disposition: A | Discharge status: Home / Self Care | Payer: No Typology Code available for payment source | Source: Ambulatory Visit | Attending: Obstetrics & Gynecology | Admitting: Obstetrics & Gynecology

## 2020-09-22 ENCOUNTER — Other Ambulatory Visit: Payer: Self-pay

## 2020-09-22 DIAGNOSIS — Z1231 Encounter for screening mammogram for malignant neoplasm of breast: Secondary | ICD-10-CM

## 2021-01-21 ENCOUNTER — Other Ambulatory Visit: Payer: Self-pay | Admitting: Neurosurgery

## 2021-01-21 DIAGNOSIS — G8929 Other chronic pain: Secondary | ICD-10-CM

## 2021-01-21 DIAGNOSIS — M545 Low back pain, unspecified: Secondary | ICD-10-CM

## 2021-02-03 ENCOUNTER — Ambulatory Visit
Admission: RE | Admit: 2021-02-03 | Discharge: 2021-02-03 | Disposition: A | Discharge status: Home / Self Care | Payer: No Typology Code available for payment source | Source: Ambulatory Visit | Attending: Neurosurgery | Admitting: Neurosurgery

## 2021-02-03 ENCOUNTER — Other Ambulatory Visit: Payer: Self-pay

## 2021-02-03 DIAGNOSIS — G8929 Other chronic pain: Secondary | ICD-10-CM

## 2021-03-04 NOTE — Therapy (Signed)
OUTPATIENT PHYSICAL THERAPY THORACOLUMBAR EVALUATION   Patient Name: Anne Haas MRN: [id number] DOB:May 30, 1975, 46 y.o., female Today's Date: 03/05/2021   PT End of Session - 03/05/21 1013     Visit Number 1    Number of Visits 8    Date for PT Re-Evaluation 04/30/21    Authorization Type [employer] employee    PT Start Time 0800    PT Stop Time 0845    PT Time Calculation (min) 45 min             No past medical history on file. No past surgical history on file. Patient Active Problem List   Diagnosis Date Noted   Bite, insect 08/04/2016    PCP: London Pepper, MD  REFERRING PROVIDER: Consuella Lose, MD  REFERRING DIAG: M54.50 (ICD-10-CM) - Low back pain, unspecified   THERAPY DIAG:  Chronic midline low back pain without sciatica  Radiculopathy, lumbosacral region  Joint stiffness of spine  ONSET DATE: 4 mos   SUBJECTIVE:                                                                                                                                                                                           SUBJECTIVE STATEMENT: Patient with pain in low back acutely about 4 mos ago.  She has pain in AM, stepping out of bed with Rt side has sharp pain and spasm. Hard to lift leg for lower body dressing .  She can move it back into position while she lies on the side. She was super stiff last season (Spring 2022) while horseback riding. She can usually feel good by late morning.  She has some shifting "in her bones" in the AM.  One time she was standing at work and nearly fell onto the bed of a patient.  She has had an MRI with no concerning findings.  She has not had injections.  She does not had numbness and tingling or LE weakness.     PERTINENT HISTORY:  L sided low back 13 yrs ago had a fall directly on tailbone, suffered with LLE radicular sx at that time.   PAIN:  Are you having pain? No, is usually a 8/10 in AMs NPRS scale: 8/10 Pain location: low back  central  Pain orientation: Bilateral R>L PAIN TYPE:  spasm, sharp Pain description: sharp  Aggravating factors: AM,laying on back  Relieving factors: moving around around , weight shifting , coughing sneezing  PRECAUTIONS: None  WEIGHT BEARING RESTRICTIONS No  FALLS:  Has patient fallen in last 6 months? No, Number of falls: 0, fell off a slick porch > 6 mos  ago  LIVING ENVIRONMENT: Lives with: lives with their family and lives alone Lives in: House/apartment Stairs: Yes; External: 5 steps; on right going up Has following equipment at home:  none   OCCUPATION: [employer] tech , full time   PLOF: Independent  PATIENT GOALS Continue horseback riding and figure this out    OBJECTIVE:   DIAGNOSTIC FINDINGS:  Mild degenerative changes without spinal canal stenosis or neural foraminal narrowing.  PATIENT SURVEYS:  FOTO 53%  SCREENING FOR RED FLAGS: Bowel or bladder incontinence: Yes: urinary leakage unrelated , stress incontinence.    COGNITION:  Overall cognitive status: Within functional limits for tasks assessed     SENSATION:  Light touch: Appears intact  Stereognosis: Appears intact  Hot/Cold: Appears intact  Proprioception: Appears intact  MUSCLE LENGTH: Hamstrings: WFL   POSTURE:  No remarkable findings, somewhat decreased lumbar lordosis LLE longer in supine? ASIS appear level in supine and standing   PALPATION: Pain along L4-L5-S1 paraspinals and lateral SI border L > R .  Stiffness throughout thoracolumbar spine.   LUMBARAROM/PROM  A/PROM A/PROM  03/05/2021  Flexion 50% central stiffness  Extension 50% pain central   Right lateral flexion 25%  Left lateral flexion 25%  Right rotation 50%  Left rotation 50%   (Blank rows = not tested)  LE AROM/PROM:  A/PROM Right 03/05/2021 Left 03/05/2021  Hip flexion WFL throughout [facility]  Throughout   Hip extension    Hip abduction    Hip adduction    Hip internal rotation    Hip external rotation    Knee  flexion    Knee extension    Ankle dorsiflexion    Ankle plantarflexion    Ankle inversion    Ankle eversion     (Blank rows = not tested)    LE MMT:  MMT Right 03/05/2021 Left 03/05/2021  Hip flexion 4/5 4+/5  Hip extension    Hip abduction 4+/5 4/5 pain   Hip adduction    Hip internal rotation    Hip external rotation    Knee flexion 5/5 5/5  Knee extension 5/5 5/5  Ankle dorsiflexion 5/5 5/5  Ankle plantarflexion    Ankle inversion    Ankle eversion     (Blank rows = not tested)  LUMBAR SPECIAL TESTS:  Single leg stance test: L trendelenburg.  Neg Slump, Neg SLR. Pos. Prone knee flexion. Compression to  SI improved pain   FUNCTIONAL TESTS:    NT NT GAIT: Distance walked: 150 Assistive device utilized: none Level of assistance: Complete Independence Comments: no deviations     TODAY'S TREATMENT  Pt eval, HEP for spine mobility, manual therapy gr I-II along lumbar spine.   PATIENT EDUCATION:  Education details: PT/POC, anatomy , eval findings  Person educated: Patient Education method: Explanation, Demonstration, Verbal cues, and Handouts Education comprehension: verbalized understanding, returned demonstration, and needs further education   HOME EXERCISE PROGRAM: Access Code: [email or web address] URL: [email or web address] Date: 03/05/2021 Prepared by: Raeford Razor  Exercises Cat Cow to Child's Pose - 1-2 x daily - 7 x weekly - 1 sets - 10 reps - 10 hold Sidelying Thoracic Rotation with Open Book - 1-2 x daily - 7 x weekly - 1 sets - 5 reps - 30 hold Supine Posterior Pelvic Tilt - 1-2 x daily - 7 x weekly - 2 sets - 10 reps - 30 hold Supine Transversus Abdominis Bracing - Hands on Stomach - 2 x daily - 7 x weekly - 1 sets - 10 reps -  10 hold Clamshell with Resistance - 1-2 x daily - 7 x weekly - 2 sets - 10 reps - 5 hold   ASSESSMENT:  CLINICAL IMPRESSION: Patient is a 46 y.o. female who was seen today for physical therapy evaluation and  treatment for chronic low back pain with possible SIJ involvement. Objective impairments include decreased mobility, difficulty walking, decreased ROM, decreased strength, hypomobility, increased fascial restrictions, increased muscle spasms, impaired flexibility, and pain. These impairments are limiting patient from being active, recreation, work duties, transitions from bed to standing, working on her farm/caring for horse.  Personal factors including Behavior pattern, Past/current experiences, Profession, Time since onset of injury/illness/exacerbation, and previous back injury, fall.  are also affecting patient's functional outcome. Patient will benefit from skilled PT to address above impairments and improve overall function.  REHAB POTENTIAL: Excellent  CLINICAL DECISION MAKING: Evolving/moderate complexity  EVALUATION COMPLEXITY: Moderate   GOALS: Goals reviewed with patient? No    LONG TERM GOALS:   LTG Name Target Date Goal status  1 Pt will be able to transition out of bed with min occasional difficulty, pain in AM  Baseline: 04/30/2021 INITIAL  2 Pt will be able to stand at work with improved posture, pain and overall fatigue Baseline: 04/30/2021 INITIAL  3 Pt will be able to show independence with HEP for spine mobility, core strength  Baseline: 04/30/2021 INITIAL  4 Pt will be able to squat , lift without increasing back pain  Baseline: 04/30/2021 INITIAL  5 FOTO score ill improve to 66% able or better to demo improved functional mobilty  Baseline: 04/30/2021 INITIAL             PLAN: PT FREQUENCY: 1x/week  PT DURATION: 8 weeks  PLANNED INTERVENTIONS: Therapeutic exercises, Therapeutic activity, Neuro Muscular re-education, Balance training, Gait training, Patient/Family education, Joint mobilization, Dry Needling, Electrical stimulation, Spinal mobilization, Cryotherapy, Moist heat, and Manual therapy  PLAN FOR NEXT SESSION: check HEP, manual, reassess SIJ and begin  stabilization    Marc Leichter 03/05/2021, 10:14 AM   Raeford Razor, PT 03/05/21 10:14 AM Phone: [phone] Fax: [phone]

## 2021-03-05 ENCOUNTER — Other Ambulatory Visit: Payer: Self-pay

## 2021-03-05 ENCOUNTER — Ambulatory Visit: Payer: No Typology Code available for payment source | Attending: Neurosurgery | Admitting: Physical Therapy

## 2021-03-05 DIAGNOSIS — G8929 Other chronic pain: Secondary | ICD-10-CM | POA: Insufficient documentation

## 2021-03-05 DIAGNOSIS — M2560 Stiffness of unspecified joint, not elsewhere classified: Secondary | ICD-10-CM | POA: Insufficient documentation

## 2021-03-05 DIAGNOSIS — M545 Low back pain, unspecified: Secondary | ICD-10-CM | POA: Diagnosis present

## 2021-03-05 DIAGNOSIS — M5417 Radiculopathy, lumbosacral region: Secondary | ICD-10-CM | POA: Insufficient documentation

## 2021-03-11 ENCOUNTER — Other Ambulatory Visit: Payer: Self-pay

## 2021-03-11 ENCOUNTER — Encounter: Payer: Self-pay | Admitting: Physical Therapy

## 2021-03-11 ENCOUNTER — Ambulatory Visit: Payer: No Typology Code available for payment source | Admitting: Physical Therapy

## 2021-03-11 DIAGNOSIS — G8929 Other chronic pain: Secondary | ICD-10-CM

## 2021-03-11 DIAGNOSIS — M2560 Stiffness of unspecified joint, not elsewhere classified: Secondary | ICD-10-CM

## 2021-03-11 DIAGNOSIS — M5417 Radiculopathy, lumbosacral region: Secondary | ICD-10-CM

## 2021-03-11 DIAGNOSIS — M545 Low back pain, unspecified: Secondary | ICD-10-CM | POA: Diagnosis not present

## 2021-03-11 NOTE — Patient Instructions (Signed)

## 2021-03-11 NOTE — Therapy (Signed)
OUTPATIENT PHYSICAL THERAPY TREATMENT NOTE   Patient Name: Anne Haas MRN: [id number] DOB:March 18, 1975, 46 y.o., female Today's Date: 03/11/2021  PCP: London Pepper, MD REFERRING PROVIDER: Consuella Lose, MD   PT End of Session - 03/11/21 1500     Visit Number 2    Number of Visits 8    Date for PT Re-Evaluation 04/30/21    Authorization Type [facility] employee    PT Start Time 0300    PT Stop Time 0340    PT Time Calculation (min) 40 min    Activity Tolerance Patient tolerated treatment well    Behavior During Therapy [facility] for tasks assessed/performed             History reviewed. No pertinent past medical history. History reviewed. No pertinent surgical history. Patient Active Problem List   Diagnosis Date Noted   Bite, insect 08/04/2016    REFERRING DIAG:  M54.50 (ICD-10-CM) - Low back pain, unspecified  THERAPY DIAG:  Chronic midline low back pain without sciatica  Radiculopathy, lumbosacral region  Joint stiffness of spine  PERTINENT HISTORY: L sided low back 13 yrs ago had a fall directly on tailbone, suffered with LLE radicular sx at that time.   PRECAUTIONS: none  SUBJECTIVE: I Have done my exercises. I skipped my exercises yesterday and I did not have time.  I don't really like the one exercise when I am on my back and lift my butt.  I have pain more in the morning and it gets better. My father had RA,  Both my parents had both knees replaced and my mom with spinal fusions.  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 No, is usually a 8/10 in AMs NPRS scale: 2/10 presently  after TPDN on 03-11-21 2/10 more loose , decreased tension and less stabbing pain Pain location: low back central  Pain orientation: Bilateral R>L PAIN TYPE:  spasm, sharp on eval  and just sore now Pain description: sharp  Aggravating factors: AM,laying on back  Relieving factors: moving around around , weight shifting , coughing sneezingAre you having pain?       All objective findings taken on eval unless otherwise noted OBJECTIVE:    DIAGNOSTIC FINDINGS:  Mild degenerative changes without spinal canal stenosis or neural foraminal narrowing.   PATIENT SURVEYS:  FOTO 53%   SCREENING FOR RED FLAGS: Bowel or bladder incontinence: Yes: urinary leakage unrelated , stress incontinence.     COGNITION:          Overall cognitive status: Within functional limits for tasks assessed                        SENSATION:          Light touch: Appears intact          Stereognosis: Appears intact          Hot/Cold: Appears intact          Proprioception: Appears intact   MUSCLE LENGTH: Hamstrings: WFL     POSTURE:  No remarkable findings, somewhat decreased lumbar lordosis LLE longer in supine? ASIS appear level in supine and standing    PALPATION: Pain along L4-L5-S1 paraspinals and lateral SI border L > R .  Stiffness throughout thoracolumbar spine.    LUMBARAROM/PROM   A/PROM A/PROM  03/05/2021  Flexion 50% central stiffness  Extension 50% pain central   Right lateral flexion 25%  Left lateral flexion 25%  Right rotation 50%  Left rotation 50%   (Blank rows = not tested)   LE AROM/PROM:   A/PROM Right 03/05/2021 Left 03/05/2021  Hip flexion WFL throughout [facility]  Throughout   Hip extension      Hip abduction      Hip adduction      Hip internal rotation      Hip external rotation      Knee flexion      Knee extension      Ankle dorsiflexion      Ankle plantarflexion      Ankle inversion      Ankle eversion       (Blank rows = not tested)       LE MMT:   MMT Right 03/05/2021 Left 03/05/2021  Hip flexion 4/5 4+/5  Hip extension      Hip abduction 4+/5 4/5 pain   Hip adduction      Hip internal rotation      Hip external rotation      Knee flexion 5/5 5/5  Knee extension 5/5 5/5  Ankle dorsiflexion 5/5 5/5  Ankle plantarflexion      Ankle inversion      Ankle eversion       (Blank rows = not tested)    LUMBAR SPECIAL TESTS:  Single leg stance test: L trendelenburg.  Neg Slump, Neg SLR. Pos. Prone knee flexion. Compression to  SI improved pain    FUNCTIONAL TESTS:    NT NT GAIT: Distance walked: 150 Assistive device utilized: none Level of assistance: Complete Independence Comments: no deviations   [facility] Adult PT Treatment:                                                DATE: 03-11-21 Therapeutic Exercise: Supine Posterior Pelvic Tilt  3 x 10 with VC and TC extra time initially to perform correctly Supine Transversus Abdominis Bracing 2 x 10 5 sec hold Cat Cow to Child's Pose - 1-2 x daily - 7 x weekly - 1 sets - 10 reps - 10 hold Sidelying Thoracic Rotation with Open Book  Left sidelying  5 reps - 30 hold on R and 3 reps in R sidelying 30 sec hold Supine Transversus Abdominis Bracing - Hands on Stomach 10 sec hold 10 x  Clamshell with Resistance - R and L 2 x 10 5 sec hold each  Manual Therapy: STW to R QL and gluteals Spinal mobs in left sidelying thoracic/ lumbar thrust   Trigger Point Dry Needling Treatment: Pre-treatment instruction: Patient instructed on dry needling rationale, procedures, and possible side effects including pain during treatment (achy,cramping feeling), bruising, drop of blood, lightheadedness, nausea, sweating. Patient Consent Given: Yes Education handout provided: Previously provided Muscles treated: Right QL and Piriformis/ R gluteals  Needle size and number:  100 mm .30 gage Electrical stimulation performed: No Parameters: N/A Treatment response/outcome: Twitch response elicited, Palpable decrease in muscle tension, and increased motion with less pain Post-treatment instructions: Patient instructed to expect possible mild to moderate muscle soreness later today and/or tomorrow. Patient instructed in methods to reduce muscle soreness and to continue prescribed HEP. If patient was dry needled over the lung field, patient was instructed on signs and symptoms  of pneumothorax and, however unlikely, to see immediate medical attention should they occur. Patient was also educated on signs and  symptoms of infection and to seek medical attention should they occur. Patient verbalized understanding of these instructions and education.    Eval- TODAY'S TREATMENT  Pt eval, HEP for spine mobility, manual therapy gr I-II along lumbar spine.     PATIENT EDUCATION:  Education details: Education on [employer] and precautians and aftercare Person educated: Patient Education method: Consulting civil engineer, Demonstration, Verbal cues, and Handouts Education comprehension: verbalized understanding, returned demonstration, and needs further education     HOME EXERCISE PROGRAM: Access Code: [email or web address] URL: [email or web address] Date: 03/05/2021 Prepared by: Raeford Razor   Exercises Cat Cow to Child's Pose - 1-2 x daily - 7 x weekly - 1 sets - 10 reps - 10 hold Sidelying Thoracic Rotation with Open Book - 1-2 x daily - 7 x weekly - 1 sets - 5 reps - 30 hold Supine Posterior Pelvic Tilt - 1-2 x daily - 7 x weekly - 2 sets - 10 reps - 30 hold Supine Transversus Abdominis Bracing - Hands on Stomach - 2 x daily - 7 x weekly - 1 sets - 10 reps - 10 hold Clamshell with Resistance - 1-2 x daily - 7 x weekly - 2 sets - 10 reps - 5 hold     ASSESSMENT:   CLINICAL IMPRESSION: Ms Estabrooks presents with 2/10 pain but with sharp pain with certain pelvic tilt movements. Pt left QL with increased tension and elevated R slight elevated pelvic level. Pt consents to TPDN and was closely monitored throughout session.  Pt also received manual therapy and Left sidelying spinal mobs/thrust with decrease in back muscle tension.  Pt after Rx session described pain as less sharp  and with less tension.but around 2/10. Ms Mitchell able to perform pelvic tilt with greater ease post TPDN  Will cont. POC    REHAB POTENTIAL: Excellent   CLINICAL DECISION MAKING: Evolving/moderate complexity    EVALUATION COMPLEXITY: Moderate     GOALS: Goals reviewed with patient? No       LONG TERM GOALS:    LTG Name Target Date Goal status  1 Pt will be able to transition out of bed with min occasional difficulty, pain in AM  Baseline: 04/30/2021 INITIAL  2 Pt will be able to stand at work with improved posture, pain and overall fatigue Baseline: 04/30/2021 INITIAL  3 Pt will be able to show independence with HEP for spine mobility, core strength  Baseline: 04/30/2021 INITIAL  4 Pt will be able to squat , lift without increasing back pain  Baseline: 04/30/2021 INITIAL  5 FOTO score ill improve to 66% able or better to demo improved functional mobilty  Baseline: 04/30/2021 INITIAL                      PLAN: PT FREQUENCY: 1x/week   PT DURATION: 8 weeks   PLANNED INTERVENTIONS: Therapeutic exercises, Therapeutic activity, Neuro Muscular re-education, Balance training, Gait training, Patient/Family education, Joint mobilization, Dry Needling, Electrical stimulation, Spinal mobilization, Cryotherapy, Moist heat, and Manual therapy   PLAN FOR NEXT SESSION:  assess TPDN and may use heel lift for LLE   manual, reassess SIJ and begin stabilization         Voncille Lo, PT, [place] Certified Exercise Expert for the Aging Adult  03/11/21 3:48 PM Phone: [phone] Fax: [phone]

## 2021-03-17 ENCOUNTER — Other Ambulatory Visit: Payer: Self-pay

## 2021-03-17 ENCOUNTER — Encounter: Payer: Self-pay | Admitting: Physical Therapy

## 2021-03-17 ENCOUNTER — Ambulatory Visit: Payer: No Typology Code available for payment source | Admitting: Physical Therapy

## 2021-03-17 DIAGNOSIS — G8929 Other chronic pain: Secondary | ICD-10-CM

## 2021-03-17 DIAGNOSIS — M5417 Radiculopathy, lumbosacral region: Secondary | ICD-10-CM

## 2021-03-17 DIAGNOSIS — M545 Low back pain, unspecified: Secondary | ICD-10-CM | POA: Diagnosis not present

## 2021-03-17 DIAGNOSIS — M2560 Stiffness of unspecified joint, not elsewhere classified: Secondary | ICD-10-CM

## 2021-03-17 NOTE — Therapy (Signed)
OUTPATIENT PHYSICAL THERAPY TREATMENT NOTE   Patient Name: Anne Haas MRN: [id number] DOB:21-May-1975, 46 y.o., female Today's Date: 03/17/2021  PCP: London Pepper, MD REFERRING PROVIDER: Consuella Lose, MD   PT End of Session - 03/17/21 1546     Visit Number 3    Number of Visits 8    Date for PT Re-Evaluation 04/30/21    Authorization Type [facility] employee    PT Start Time 1545    PT Stop Time 1640    PT Time Calculation (min) 55 min    Activity Tolerance Patient tolerated treatment well    Behavior During Therapy [facility] for tasks assessed/performed             History reviewed. No pertinent past medical history. History reviewed. No pertinent surgical history. Patient Active Problem List   Diagnosis Date Noted   Bite, insect 08/04/2016    REFERRING DIAG:  M54.50 (ICD-10-CM) - Low back pain, unspecified  THERAPY DIAG:  Chronic midline low back pain without sciatica  Radiculopathy, lumbosacral region  Joint stiffness of spine  PERTINENT HISTORY: L sided low back 13 yrs ago had a fall directly on tailbone, suffered with LLE radicular sx at that time.   PRECAUTIONS: none  SUBJECTIVE: II was so much better.  Now I'm made because its back!1  PAIN:   Are you having pain? Yes, moderate Rt low back and central L spine  NPRS scale: 2/10 presently  after TPDN on 03-11-21 2/10 more loose , decreased tension and less stabbing pain Pain location: low back central  Pain orientation: Bilateral R>L PAIN TYPE:  spasm, sharp on eval  and just sore now Pain description: sharp  Aggravating factors: AM,laying on back  Relieving factors: moving around around , weight shifting , coughing sneezingAre you having pain?      All objective findings taken on eval unless otherwise noted OBJECTIVE:    DIAGNOSTIC FINDINGS:  Mild degenerative changes without spinal canal stenosis or neural foraminal narrowing.   PATIENT SURVEYS:  FOTO 53%   SCREENING FOR RED FLAGS: Bowel or  bladder incontinence: Yes: urinary leakage unrelated , stress incontinence.     COGNITION:          Overall cognitive status: Within functional limits for tasks assessed                        SENSATION:          Light touch: Appears intact          Stereognosis: Appears intact          Hot/Cold: Appears intact          Proprioception: Appears intact   MUSCLE LENGTH: Hamstrings: WFL     POSTURE:  No remarkable findings, somewhat decreased lumbar lordosis LLE longer in supine? ASIS appear level in supine and standing    PALPATION: Pain along L4-L5-S1 paraspinals and lateral SI border L > R .  Stiffness throughout thoracolumbar spine.    LUMBARAROM/PROM   A/PROM A/PROM  03/05/2021  Flexion 50% central stiffness  Extension 50% pain central   Right lateral flexion 25%  Left lateral flexion 25%  Right rotation 50%  Left rotation 50%   (Blank rows = not tested)   LE AROM/PROM:   A/PROM Right 03/05/2021 Left 03/05/2021  Hip flexion WFL throughout [facility]  Throughout   Hip extension      Hip abduction      Hip adduction  Hip internal rotation      Hip external rotation      Knee flexion      Knee extension      Ankle dorsiflexion      Ankle plantarflexion      Ankle inversion      Ankle eversion       (Blank rows = not tested)       LE MMT:   MMT Right 03/05/2021 Left 03/05/2021  Hip flexion 4/5 4+/5  Hip extension      Hip abduction 4+/5 4/5 pain   Hip adduction      Hip internal rotation      Hip external rotation      Knee flexion 5/5 5/5  Knee extension 5/5 5/5  Ankle dorsiflexion 5/5 5/5  Ankle plantarflexion      Ankle inversion      Ankle eversion       (Blank rows = not tested)   LUMBAR SPECIAL TESTS:  Single leg stance test: L trendelenburg.  Neg Slump, Neg SLR. Pos. Prone knee flexion. Compression to  SI improved pain    FUNCTIONAL TESTS:    NT NT GAIT: Distance walked: 150 Assistive device utilized: none Level of assistance: Complete  Independence Comments: no deviations   [facility] Adult PT Treatment:                                                DATE: 03-17-21 Therapeutic Exercise: Supine Posterior Pelvic Tilt  2 x 10 with VC and TC extra time initially to perform correctly Supine Transversus Abdominis Bracing 2 x 10 5 sec hold Bilateral and unilateral clam with ball under pelvis.  Posterior tilt improves pain.   Supine 20-May-2022 with ball x 10  Dead bug variation  Hip adduction with ball x 10 with bridge (painful) Iso hip abduction blue band x 10 with bridge, less pain   Manual Therapy: STW to R QL and gluteals Spinal mobs in left sidelying T/L spine  MFR Rt trunk   Cold pack 10 min hooklying to LS spine    [facility] Adult PT Treatment:                                                DATE: 03-11-21 Therapeutic Exercise: Supine Posterior Pelvic Tilt  3 x 10 with VC and TC extra time initially to perform correctly Supine Transversus Abdominis Bracing 2 x 10 5 sec hold Cat Cow to Child's Pose - 1-2 x daily - 7 x weekly - 1 sets - 10 reps - 10 hold Sidelying Thoracic Rotation with Open Book  Left sidelying  5 reps - 30 hold on R and 3 reps in R sidelying 30 sec hold Supine Transversus Abdominis Bracing - Hands on Stomach 10 sec hold 10 x  Clamshell with Resistance - R and L 2 x 10 5 sec hold each  Manual Therapy: STW to R QL and gluteals Spinal mobs in left sidelying thoracic/ lumbar thrust   Trigger Point Dry Needling Treatment: Pre-treatment instruction: Patient instructed on dry needling rationale, procedures, and possible side effects including pain during treatment (achy,cramping feeling), bruising, drop of blood, lightheadedness, nausea, sweating. Patient Consent Given: Yes Education handout provided: Previously  provided Muscles treated: Right QL and Piriformis/ R gluteals  Needle size and number:  100 mm .30 gage Electrical stimulation performed: No Parameters: N/A Treatment response/outcome: Twitch response  elicited, Palpable decrease in muscle tension, and increased motion with less pain Post-treatment instructions: Patient instructed to expect possible mild to moderate muscle soreness later today and/or tomorrow. Patient instructed in methods to reduce muscle soreness and to continue prescribed HEP. If patient was dry needled over the lung field, patient was instructed on signs and symptoms of pneumothorax and, however unlikely, to see immediate medical attention should they occur. Patient was also educated on signs and symptoms of infection and to seek medical attention should they occur. Patient verbalized understanding of these instructions and education.    Eval- TODAY'S TREATMENT  Pt eval, HEP for spine mobility, manual therapy gr I-II along lumbar spine.     PATIENT EDUCATION:  Education details: stabiliation Person educated: Patient Education method: Consulting civil engineer, Media planner, Verbal cues, and Handouts Education comprehension: verbalized understanding, returned demonstration, and needs further education     HOME EXERCISE PROGRAM: Access Code: [email or web address] URL: [email or web address] Date: 03/05/2021 Prepared by: Raeford Razor   ASSESSMENT:   CLINICAL IMPRESSION: Patient with excellent results from dry needling last session.  She had several great days and AMs.  She rolled over in bed on Monday and the pain returned, it is less in intensity however.  Initiated stabilization today, pain reduced with activation of ER/abd to stabilize SIJ.  Soft tissue followed by ice pack to reduce inflammation and stability.       REHAB POTENTIAL: Excellent   CLINICAL DECISION MAKING: Evolving/moderate complexity   EVALUATION COMPLEXITY: Moderate     GOALS: Goals reviewed with patient? No       LONG TERM GOALS:    LTG Name Target Date Goal status  1 Pt will be able to transition out of bed with min occasional difficulty, pain in AM  Baseline: 04/30/2021 INITIAL  2 Pt will be  able to stand at work with improved posture, pain and overall fatigue Baseline: 04/30/2021 INITIAL  3 Pt will be able to show independence with HEP for spine mobility, core strength  Baseline: 04/30/2021 INITIAL  4 Pt will be able to squat , lift without increasing back pain  Baseline: 04/30/2021 INITIAL  5 FOTO score ill improve to 66% able or better to demo improved functional mobilty  Baseline: 04/30/2021 INITIAL                      PLAN: PT FREQUENCY: 1x/week   PT DURATION: 8 weeks   PLANNED INTERVENTIONS: Therapeutic exercises, Therapeutic activity, Neuro Muscular re-education, Balance training, Gait training, Patient/Family education, Joint mobilization, Dry Needling, Electrical stimulation, Spinal mobilization, Cryotherapy, Moist heat, and Manual therapy   PLAN FOR NEXT SESSION:  assess TPDN and may use heel lift for LLE   manual, reassess SIJ and review stabilization      Raeford Razor, PT 03/17/21 4:42 PM Phone: [phone] Fax: [phone]

## 2021-03-23 NOTE — Therapy (Signed)
OUTPATIENT PHYSICAL THERAPY TREATMENT NOTE   Patient Name: Anne Haas MRN: [id number] DOB:[birth year], 46 y.o., female Today's Date: 03/25/2021  PCP: London Pepper, MD REFERRING PROVIDER: Consuella Lose, MD   PT End of Session - 03/25/21 1403     Visit Number 4    Number of Visits 8    Date for PT Re-Evaluation 04/30/21    Authorization Type [employer] employee    PT Start Time 1332    PT Stop Time 1420    PT Time Calculation (min) 48 min    Activity Tolerance Patient tolerated treatment well    Behavior During Therapy [facility] for tasks assessed/performed              History reviewed. No pertinent past medical history. History reviewed. No pertinent surgical history. Patient Active Problem List   Diagnosis Date Noted   Bite, insect 08/04/2016    REFERRING DIAG:  M54.50 (ICD-10-CM) - Low back pain, unspecified  THERAPY DIAG:  Chronic midline low back pain without sciatica  Radiculopathy, lumbosacral region  Joint stiffness of spine  PERTINENT HISTORY: L sided low back 13 yrs ago had a fall directly on tailbone, suffered with LLE radicular sx at that time.   PRECAUTIONS: none  SUBJECTIVE: I felt good two days after the needling but then I was rolling out of the bed on Monday morning and I felt electrified pain to the lumbo sacra area.  I was looking forward to needling today.  PAIN:            Are you having pain? Yes, moderate Rt and Lt central low back spine shoots into R>L sacral area  NPRS scale: 1/10 presently  but  with Left lumbosacral stiffness .  when turning in bed or with pelvic tilts 7/10. Pain location: low back central  Pain orientation: Bilateral R>L PAIN TYPE:  spasm, sharp on eval  and just sore now Pain description: sharp  Aggravating factors: AM,laying on back  Relieving factors: moving around around , weight shifting , coughing sneezingAre you having pain?      All objective findings taken on eval unless otherwise noted OBJECTIVE:     DIAGNOSTIC FINDINGS:  Mild degenerative changes without spinal canal stenosis or neural foraminal narrowing.   PATIENT SURVEYS:  FOTO 53%   SCREENING FOR RED FLAGS: Bowel or bladder incontinence: Yes: urinary leakage unrelated , stress incontinence.     COGNITION:          Overall cognitive status: Within functional limits for tasks assessed                        SENSATION:          Light touch: Appears intact          Stereognosis: Appears intact          Hot/Cold: Appears intact          Proprioception: Appears intact   MUSCLE LENGTH: Hamstrings: WFL     POSTURE:  No remarkable findings, somewhat decreased lumbar lordosis LLE longer in supine? ASIS appear level in supine and standing    PALPATION: Pain along L4-L5-S1 paraspinals and lateral SI border L > R .  Stiffness throughout thoracolumbar spine.    LUMBARAROM/PROM   A/PROM A/PROM  03/05/2021  Flexion 50% central stiffness  Extension 50% pain central   Right lateral flexion 25%  Left lateral flexion 25%  Right rotation 50%  Left rotation 50%   (Blank rows =  not tested)   LE AROM/PROM:   A/PROM Right 03/05/2021 Left 03/05/2021  Hip flexion WFL throughout [facility]  Throughout   Hip extension      Hip abduction      Hip adduction      Hip internal rotation      Hip external rotation      Knee flexion      Knee extension      Ankle dorsiflexion      Ankle plantarflexion      Ankle inversion      Ankle eversion       (Blank rows = not tested)       LE MMT:   MMT Right 03/05/2021 Left 03/05/2021  Hip flexion 4/5 4+/5  Hip extension      Hip abduction 4+/5 4/5 pain   Hip adduction      Hip internal rotation      Hip external rotation      Knee flexion 5/5 5/5  Knee extension 5/5 5/5  Ankle dorsiflexion 5/5 5/5  Ankle plantarflexion      Ankle inversion      Ankle eversion       (Blank rows = not tested)   LUMBAR SPECIAL TESTS:  Single leg stance test: L trendelenburg.  Neg Slump, Neg SLR.  Pos. Prone knee flexion. Compression to  SI improved pain    FUNCTIONAL TESTS:    NT NT GAIT: Distance walked: 150 Assistive device utilized: none Level of assistance: Complete Independence Comments: no deviations   [facility] Adult PT Treatment:                                                DATE: 03-25-21 Therapeutic Exercise: Supine Posterior Pelvic Tilt  2 x 10 with VC and TC , pt tends to exaggerate movement. VC to perform in pain free range Supine Transversus Abdominis Bracing 2 x 10 5 sec hold Bilateral and unilateral clam with ball under pelvis.  Post mobilization with no pain  Supine 05/09/2022 with ball x 10  Dead bug variation  with supine 05/09/2022 only Initial hip hinging at end of session   Manual Therapy: Post TPDN pt supine. Pt locked on left SI, PA mob at ischial tuberosity with knee to chest (pt reSTW to R QL and gluteals) with pt decrease in pain and stiffness Spinal mobs in left sidelying T/L spine  with cavitation MFR Bil QL and  STW bil lumbar sacral paraspinals  Trigger Point Dry Needling Treatment: Pre-treatment instruction: Patient instructed on dry needling rationale, procedures, and possible side effects including pain during treatment (achy,cramping feeling), bruising, drop of blood, lightheadedness, nausea, sweating. Patient Consent Given: Yes Education handout provided: Previously provided Muscles treated:  left QL and Piriformis/ L  gluteals  R and L SI Needle size and number:  100 mm .30 gage 50 mm .30gage Electrical stimulation performed: yes Parameters: 20pps to pt tolerance  4 needles lumbar sacral for 10 min Treatment response/outcome: Twitch response elicited, Palpable decrease in muscle tension, and increased motion with less pain Post-treatment instructions: Patient instructed to expect possible mild to moderate muscle soreness later today and/or tomorrow. Patient instructed in methods to reduce muscle soreness and to continue prescribed HEP. If patient was  dry needled over the lung field, patient was instructed on signs and symptoms of pneumothorax and, however unlikely, to  see immediate medical attention should they occur. Patient was also educated on signs and symptoms of infection and to seek medical attention should they occur. Patient verbalized understanding of these instructions and education.   [facility] Adult PT Treatment:                                                DATE: 03-17-21 Therapeutic Exercise: Supine Posterior Pelvic Tilt  2 x 10 with VC and TC extra time initially to perform correctly Supine Transversus Abdominis Bracing 2 x 10 5 sec hold Bilateral and unilateral clam with ball under pelvis.  Posterior tilt improves pain.   Supine 2022/06/01 with ball x 10  Dead bug variation  Hip adduction with ball x 10 with bridge (painful) Iso hip abduction blue band x 10 with bridge, less pain   Manual Therapy: STW to R QL and gluteals Spinal mobs in left sidelying T/L spine  MFR Rt trunk   Cold pack 10 min hooklying to LS spine    [facility] Adult PT Treatment:                                                DATE: 03-11-21 Therapeutic Exercise: Supine Posterior Pelvic Tilt  3 x 10 with VC and TC extra time initially to perform correctly Supine Transversus Abdominis Bracing 2 x 10 5 sec hold Cat Cow to Child's Pose - 1-2 x daily - 7 x weekly - 1 sets - 10 reps - 10 hold Sidelying Thoracic Rotation with Open Book  Left sidelying  5 reps - 30 hold on R and 3 reps in R sidelying 30 sec hold Supine Transversus Abdominis Bracing - Hands on Stomach 10 sec hold 10 x  Clamshell with Resistance - R and L 2 x 10 5 sec hold each   Trigger Point Dry Needling Treatment: Pre-treatment instruction: Patient instructed on dry needling rationale, procedures, and possible side effects including pain during treatment (achy,cramping feeling), bruising, drop of blood, lightheadedness, nausea, sweating. Patient Consent Given: Yes Education handout provided: Previously  provided Muscles treated: Right QL and Piriformis/ R gluteals  Needle size and number:  100 mm .30 gage Electrical stimulation performed: No Parameters: N/A Treatment response/outcome: Twitch response elicited, Palpable decrease in muscle tension, and increased motion with less pain Post-treatment instructions: Patient instructed to expect possible mild to moderate muscle soreness later today and/or tomorrow. Patient instructed in methods to reduce muscle soreness and to continue prescribed HEP. If patient was dry needled over the lung field, patient was instructed on signs and symptoms of pneumothorax and, however unlikely, to see immediate medical attention should they occur. Patient was also educated on signs and symptoms of infection and to seek medical attention should they occur. Patient verbalized understanding of these instructions and education.  Manual Therapy: STW to R QL and gluteals Spinal mobs in left sidelying thoracic/ lumbar thrust   Trigger Point Dry Needling Treatment: Pre-treatment instruction: Patient instructed on dry needling rationale, procedures, and possible side effects including pain during treatment (achy,cramping feeling), bruising, drop of blood, lightheadedness, nausea, sweating. Patient Consent Given: Yes Education handout provided: Previously provided Muscles treated: Right QL and Piriformis/ R gluteals  Needle size and number:  100 mm .[place]  gage Electrical stimulation performed: No Parameters: N/A Treatment response/outcome: Twitch response elicited, Palpable decrease in muscle tension, and increased motion with less pain Post-treatment instructions: Patient instructed to expect possible mild to moderate muscle soreness later today and/or tomorrow. Patient instructed in methods to reduce muscle soreness and to continue prescribed HEP. If patient was dry needled over the lung field, patient was instructed on signs and symptoms of pneumothorax and, however  unlikely, to see immediate medical attention should they occur. Patient was also educated on signs and symptoms of infection and to seek medical attention should they occur. Patient verbalized understanding of these instructions and education.    Eval- TODAY'S TREATMENT  Pt eval, HEP for spine mobility, manual therapy gr I-II along lumbar spine.     PATIENT EDUCATION:  Education details: stabiliation Person educated: Patient Education method: Consulting civil engineer, Media planner, Verbal cues, and Handouts Education comprehension: verbalized understanding, returned demonstration, and needs further education     HOME EXERCISE PROGRAM: Access Code: [email or web address] URL: [email or web address] Date: 03/05/2021 Prepared by: Raeford Razor   ASSESSMENT:   CLINICAL IMPRESSION: Patient with  Left lumbo sacral stiffness.  She has difficulty riding horses for recreation.  Ms Laughner reports continued pain since she rose from bed on Monday. Pt consented to TPDN today and was closely monitored.  Pt reports feeling 1/10 pain at end of session and feeling more loose and less irritated on Left side. Pt did respond to manual/lumbosacral mobs.  Pt reports pain with simple pelvic tilt movement and simulates pain that is reproduced by riding on a horse.  Pt does seem to demonstrate more movement on R than on left.  Pt with lumbosacral dissociation L>R.  Pt may need additional visits to encourage lumbo sacral stabilization do decrease varied range of motion in Left and right lumbosacral area.     REHAB POTENTIAL: Excellent   CLINICAL DECISION MAKING: Evolving/moderate complexity   EVALUATION COMPLEXITY: Moderate     GOALS: Goals reviewed with patient? No       LONG TERM GOALS:    LTG Name Target Date Goal status  1 Pt will be able to transition out of bed with min occasional difficulty, pain in AM  Baseline: 04/30/2021 INITIAL  2 Pt will be able to stand at work with improved posture, pain and overall  fatigue Baseline: 04/30/2021 INITIAL  3 Pt will be able to show independence with HEP for spine mobility, core strength  Baseline: 04/30/2021 INITIAL  4 Pt will be able to squat , lift without increasing back pain  Baseline: 04/30/2021 INITIAL  5 FOTO score ill improve to 66% able or better to demo improved functional mobilty  Baseline: 04/30/2021 INITIAL                      PLAN: PT FREQUENCY: 1x/week   PT DURATION: 8 weeks   PLANNED INTERVENTIONS: Therapeutic exercises, Therapeutic activity, Neuro Muscular re-education, Balance training, Gait training, Patient/Family education, Joint mobilization, Dry Needling, Electrical stimulation, Spinal mobilization, Cryotherapy, Moist heat, and Manual therapy   PLAN FOR NEXT SESSION:  ERO, add additional visits for TPDN and lumbosacral stabilization.  Check lumbosacral movement Left to R comparison.  Less dissociation on Left lumbosacral area  reassess SIJ and review stabilization      Voncille Lo, PT, [place] Certified Exercise Expert for the Aging Adult  03/25/21 3:26 PM Phone: [phone] Fax: [phone]

## 2021-03-25 ENCOUNTER — Ambulatory Visit: Payer: No Typology Code available for payment source | Attending: Neurosurgery | Admitting: Physical Therapy

## 2021-03-25 ENCOUNTER — Encounter: Payer: Self-pay | Admitting: Physical Therapy

## 2021-03-25 ENCOUNTER — Other Ambulatory Visit: Payer: Self-pay

## 2021-03-25 DIAGNOSIS — M2560 Stiffness of unspecified joint, not elsewhere classified: Secondary | ICD-10-CM | POA: Insufficient documentation

## 2021-03-25 DIAGNOSIS — M545 Low back pain, unspecified: Secondary | ICD-10-CM | POA: Insufficient documentation

## 2021-03-25 DIAGNOSIS — M5417 Radiculopathy, lumbosacral region: Secondary | ICD-10-CM | POA: Diagnosis present

## 2021-03-25 DIAGNOSIS — G8929 Other chronic pain: Secondary | ICD-10-CM | POA: Insufficient documentation

## 2021-03-27 ENCOUNTER — Ambulatory Visit: Payer: No Typology Code available for payment source

## 2021-04-02 ENCOUNTER — Encounter: Payer: Self-pay | Admitting: Physical Therapy

## 2021-04-02 ENCOUNTER — Ambulatory Visit: Payer: No Typology Code available for payment source | Admitting: Physical Therapy

## 2021-04-02 ENCOUNTER — Other Ambulatory Visit: Payer: Self-pay

## 2021-04-02 DIAGNOSIS — G8929 Other chronic pain: Secondary | ICD-10-CM

## 2021-04-02 DIAGNOSIS — M545 Low back pain, unspecified: Secondary | ICD-10-CM | POA: Diagnosis not present

## 2021-04-02 DIAGNOSIS — M2560 Stiffness of unspecified joint, not elsewhere classified: Secondary | ICD-10-CM

## 2021-04-02 DIAGNOSIS — M5417 Radiculopathy, lumbosacral region: Secondary | ICD-10-CM

## 2021-04-02 NOTE — Therapy (Signed)
OUTPATIENT PHYSICAL THERAPY TREATMENT NOTE/Renewal   Patient Name: Anne Haas MRN: [id number] DOB:Nov 25, 1975, 46 y.o., female Today's Date: 04/02/2021  PCP: London Pepper, MD REFERRING PROVIDER: Consuella Lose, MD   PT End of Session - 04/02/21 0801     Visit Number 5    Number of Visits 8    Date for PT Re-Evaluation 04/30/21    Authorization Type [employer] employee    PT Start Time 0800    PT Stop Time 0847    PT Time Calculation (min) 47 min    Activity Tolerance Patient tolerated treatment well    Behavior During Therapy [facility] for tasks assessed/performed               History reviewed. No pertinent past medical history. History reviewed. No pertinent surgical history. Patient Active Problem List   Diagnosis Date Noted   Bite, insect 08/04/2016    REFERRING DIAG:  M54.50 (ICD-10-CM) - Low back pain, unspecified  THERAPY DIAG:  Chronic midline low back pain without sciatica  Radiculopathy, lumbosacral region  Joint stiffness of spine  PERTINENT HISTORY: L sided low back 13 yrs ago had a fall directly on tailbone, suffered with LLE radicular sx at that time.   PRECAUTIONS: none  SUBJECTIVE: I have had a better week .  She is taking a different approach.  She has had several days of no pain when walking up.  I was doing the exercises wrong. Feeling positive .    I felt good two days after the needling but then I was rolling out of the bed on Monday morning and I felt electrified pain to the lumbo sacra area.  I was looking forward to needling today.  PAIN:            Are you having pain? None,  NPRS scale: 0-1/10 presently.  Pain location: low back central  Pain orientation: Bilateral R>L PAIN TYPE:  spasm, sharp on eval  and just sore now Pain description: sharp  Aggravating factors: AM,laying on back  Relieving factors: moving around around , weight shifting , coughing sneezingAre you having pain?      All objective findings taken on eval unless  otherwise noted OBJECTIVE:    DIAGNOSTIC FINDINGS:  Mild degenerative changes without spinal canal stenosis or neural foraminal narrowing.   PATIENT SURVEYS:  FOTO 53%   SCREENING FOR RED FLAGS: Bowel or bladder incontinence: Yes: urinary leakage unrelated , stress incontinence.     COGNITION:          Overall cognitive status: Within functional limits for tasks assessed                        SENSATION:          Light touch: Appears intact          Stereognosis: Appears intact          Hot/Cold: Appears intact          Proprioception: Appears intact   MUSCLE LENGTH: Hamstrings: WFL     POSTURE:  No remarkable findings, somewhat decreased lumbar lordosis LLE longer in supine? ASIS appear level in supine and standing    PALPATION: Pain along L4-L5-S1 paraspinals and lateral SI border L > R .  Stiffness throughout thoracolumbar spine.    LUMBARAROM/PROM   A/PROM A/PROM  03/05/2021  Flexion 50% central stiffness  Extension 50% pain central   Right lateral flexion 25%  Left lateral flexion 25%  Right  50%  °Left rotation 50%  ° (Blank rows = not tested) °  °LE AROM/PROM: °  °A/PROM Right °03/05/2021 Left °03/05/2021  °Hip flexion WFL throughout WFL  °Throughout   °Hip extension      °Hip abduction      °Hip adduction      °Hip internal rotation      °Hip external rotation      °Knee flexion      °Knee extension      °Ankle dorsiflexion      °Ankle plantarflexion      °Ankle inversion      °Ankle eversion      ° (Blank rows = not tested) °  °  °  °LE MMT: °  °MMT Right °03/05/2021 Left °03/05/2021  °Hip flexion 4/5 4+/5  °Hip extension      °Hip abduction 4+/5 4/5 pain   °Hip adduction      °Hip internal rotation      °Hip external rotation      °Knee flexion 5/5 5/5  °Knee extension 5/5 5/5  °Ankle dorsiflexion 5/5 5/5  °Ankle plantarflexion      °Ankle inversion      °Ankle eversion      ° (Blank rows = not tested) °  °LUMBAR SPECIAL TESTS:  °Single leg stance test: L  trendelenburg.  Neg Slump, Neg SLR. Pos. Prone knee flexion. Compression to  SI improved pain  °  °FUNCTIONAL TESTS:  °  NT °NT °GAIT: °Distance walked: 150 °Assistive device utilized: none °Level of assistance: Complete Independence °Comments: no deviations  ° ° °[facility] Adult PT Treatment:                                                DATE: 04/02/21 °Therapeutic Exercise: °Recumbent bike L2 for 6 min  °Supine Tr A with ball squeeze x 10 °Supine stabilization °Lat pull down bilateral , 1 x 8 lbs wgt. In hook lying then x 10 in 90/90  °Dead bug alt.  Knee extension with alt UE lift  °Ball under sacrum for bent knee fall out alternating no band  °Unilateral horizontal abd  x 10 x 6 lbs in hook lying with ball  °Sit to stand 15 lbs x 15 °Step up lateral x 10 ( 8 inch step) °Standing hip abduction off step x 15 with neutral pelvis, cues  °Reverse hinge off 8 inch step x 10 for hip extension °Seated piriformis  ° ° ° ° °[facility] Adult PT Treatment:                                                DATE: 03-25-21 °Therapeutic Exercise: °Supine Posterior Pelvic Tilt  2 x 10 with VC and TC , pt tends to exaggerate movement. VC to perform in pain free range °Supine Transversus Abdominis Bracing 2 x 10 5 sec hold °Bilateral and unilateral clam with ball under pelvis.  Post mobilization with no pain  °Supine march with ball x 10  °Dead bug variation  with supine march only °Initial hip hinging at end of session ° ° °Manual Therapy: °Post TPDN pt supine. Pt locked on left SI, PA mob at ischial tuberosity with knee to chest (pt   reSTW to R QL and gluteals) with pt decrease in pain and stiffness °Spinal mobs in left sidelying T/L spine  with cavitation °MFR Bil QL and  °STW bil lumbar sacral paraspinals ° Trigger Point Dry Needling Treatment: °Pre-treatment instruction: Patient instructed on dry needling rationale, procedures, and possible side effects including pain during treatment (achy,cramping feeling), bruising, drop of blood,  lightheadedness, nausea, sweating. °Patient Consent Given: Yes °Education handout provided: Previously provided °Muscles treated:  left QL and Piriformis/ L  gluteals  R and L SI °Needle size and number:  100 mm .30 gage 50 mm .30gage °Electrical stimulation performed: yes °Parameters: 20pps to pt tolerance  4 needles lumbar sacral for 10 min °Treatment response/outcome: Twitch response elicited, Palpable decrease in muscle tension, and increased motion with less pain °Post-treatment instructions: Patient instructed to expect possible mild to moderate muscle soreness later today and/or tomorrow. Patient instructed in methods to reduce muscle soreness and to continue prescribed HEP. If patient was dry needled over the lung field, patient was instructed on signs and symptoms of pneumothorax and, however unlikely, to see immediate medical attention should they occur. Patient was also educated on signs and symptoms of infection and to seek medical attention should they occur. Patient verbalized understanding of these instructions and education. ° ° °[facility] Adult PT Treatment:                                                DATE: 03-17-21 °Therapeutic Exercise: °Supine Posterior Pelvic Tilt  2 x 10 with VC and TC extra time initially to perform correctly °Supine Transversus Abdominis Bracing 2 x 10 5 sec hold °Bilateral and unilateral clam with ball under pelvis.  Posterior tilt improves pain.   °Supine march with ball x 10  °Dead bug variation  °Hip adduction with ball x 10 with bridge (painful) °Iso hip abduction blue band x 10 with bridge, less pain  ° °Manual Therapy: °STW to R QL and gluteals °Spinal mobs in left sidelying T/L spine  °MFR Rt trunk  ° °Cold pack 10 min hooklying to LS spine  ° °   ° Eval- TODAY'S TREATMENT  °Pt eval, HEP for spine mobility, manual therapy gr I-II along lumbar spine. °  °  °PATIENT EDUCATION:  °Education details: stabiliation °Person educated: Patient °Education method: Explanation,  Demonstration, Verbal cues, and Handouts °Education comprehension: verbalized understanding, returned demonstration, and needs further education °  °  °HOME EXERCISE PROGRAM: °Access Code: [email or web address] °URL: [email or web address] °Date: 03/05/2021 °Prepared by: Jennifer Paa °  °ASSESSMENT: °  °CLINICAL IMPRESSION: ° °Patient is making progress, increased FOTO score and is on track to meet goals.  She continues to have difficulty maintaining level pelvis in standing, has mild increase in back pain with flat back supine core work.  Offered modifications and progressions.  No pain post session.  ° ° °  °REHAB POTENTIAL: Excellent °  °CLINICAL DECISION MAKING: Evolving/moderate complexity °  °EVALUATION COMPLEXITY: Moderate °  °  °GOALS: °Goals reviewed with patient? No °  °  °  °LONG TERM GOALS:  °  °LTG Name Target Date Goal status  °1 Pt will be able to transition out of bed with min occasional difficulty, pain in AM  °Baseline: in progress trending towards met  04/30/2021 Ongoing   °2 Pt will be able to stand at work with improved posture, pain   pain and overall fatigue Baseline: improving  04/30/2021 Ongoing   3 Pt will be able to show independence with HEP for spine mobility, core strength  Baseline:up to date  04/30/2021 Ongoing   4 Pt will be able to squat , lift without increasing back pain  Baseline: in progress  04/30/2021 Ongoing   5 FOTO score ill improve to 66% able or better to demo improved functional mobilty  Baseline: 04/30/2021 Ongoing                       PLAN: PT FREQUENCY: 1x/week   PT DURATION: 8 weeks   PLANNED INTERVENTIONS: Therapeutic exercises, Therapeutic activity, Neuro Muscular re-education, Balance training, Gait training, Patient/Family education, Joint mobilization, Dry Needling, Electrical stimulation, Spinal mobilization, Cryotherapy, Moist heat, and Manual therapy   PLAN FOR NEXT SESSION:  Cont POC.  Standing stability, glutes as tolerate. TPDN and lumbosacral  stabilization.  Check lumbosacral movement Left to R comparison.  Less dissociation on Left lumbosacral area  reassess SIJ and review stabilization   Raeford Razor, PT 04/02/21 9:43 AM Phone: [phone] Fax: [phone]

## 2021-04-07 NOTE — Therapy (Signed)
OUTPATIENT PHYSICAL THERAPY TREATMENT NOTE/Renewal   Patient Name: Anne Haas MRN: [id number] DOB:10/09/1975, 46 y.o., female Today's Date: 04/08/2021  PCP: London Pepper, MD REFERRING PROVIDER: Consuella Lose, MD   PT End of Session - 04/08/21 1502     Visit Number 6    Number of Visits 8    Date for PT Re-Evaluation 04/30/21    Authorization Type [employer] employee    PT Start Time 1502    PT Stop Time 3299    PT Time Calculation (min) 57 min    Activity Tolerance Patient tolerated treatment well    Behavior During Therapy [facility] for tasks assessed/performed                History reviewed. No pertinent past medical history. History reviewed. No pertinent surgical history. Patient Active Problem List   Diagnosis Date Noted   Bite, insect 08/04/2016    REFERRING DIAG:  M54.50 (ICD-10-CM) - Low back pain, unspecified  THERAPY DIAG:  Chronic midline low back pain without sciatica  Radiculopathy, lumbosacral region  Joint stiffness of spine  PERTINENT HISTORY: L sided low back 13 yrs ago had a fall directly on tailbone, suffered with LLE radicular sx at that time.   PRECAUTIONS: none  SUBJECTIVE: I am doing better about getting out of bed now trying to keep myself more " square".  I haven't been about to lift the feed at all in the last 3 weeks. Just resting   but  the pain in the morning is less intense.   PAIN:            Are you having pain? None,  NPRS scale: 0/10 Pain location: low back central  Pain orientation: Bilateral R>L PAIN TYPE:  spasm, sharp on eval  and just sore now Pain description: sharp  Aggravating factors: AM,laying on back  Relieving factors: moving around around , weight shifting , coughing sneezingAre you having pain?      All objective findings taken on eval unless otherwise noted OBJECTIVE:    DIAGNOSTIC FINDINGS:  Mild degenerative changes without spinal canal stenosis or neural foraminal narrowing.   PATIENT SURVEYS:   FOTO 53%   SCREENING FOR RED FLAGS: Bowel or bladder incontinence: Yes: urinary leakage unrelated , stress incontinence.     COGNITION:          Overall cognitive status: Within functional limits for tasks assessed                        SENSATION:          Light touch: Appears intact          Stereognosis: Appears intact          Hot/Cold: Appears intact          Proprioception: Appears intact   MUSCLE LENGTH: Hamstrings: WFL     POSTURE:  No remarkable findings, somewhat decreased lumbar lordosis LLE longer in supine? ASIS appear level in supine and standing    PALPATION: Pain along L4-L5-S1 paraspinals and lateral SI border L > R .  Stiffness throughout thoracolumbar spine.    LUMBARAROM/PROM   A/PROM A/PROM  03/05/2021  Flexion 50% central stiffness  Extension 50% pain central   Right lateral flexion 25%  Left lateral flexion 25%  Right rotation 50%  Left rotation 50%   (Blank rows = not tested)   LE AROM/PROM:   A/PROM Right 03/05/2021 Left 03/05/2021  Hip flexion WFL throughout [facility]  Throughout   Hip extension      Hip abduction      Hip adduction      Hip internal rotation      Hip external rotation      Knee flexion      Knee extension      Ankle dorsiflexion      Ankle plantarflexion      Ankle inversion      Ankle eversion       (Blank rows = not tested)       LE MMT:   MMT Right 03/05/2021 Left 03/05/2021  Hip flexion 4/5 4+/5  Hip extension      Hip abduction 4+/5 4/5 pain   Hip adduction      Hip internal rotation      Hip external rotation      Knee flexion 5/5 5/5  Knee extension 5/5 5/5  Ankle dorsiflexion 5/5 5/5  Ankle plantarflexion      Ankle inversion      Ankle eversion       (Blank rows = not tested)   LUMBAR SPECIAL TESTS:  Single leg stance test: L trendelenburg.  Neg Slump, Neg SLR. Pos. Prone knee flexion. Compression to  SI improved pain    FUNCTIONAL TESTS:    NT NT GAIT: Distance walked: 150 Assistive  device utilized: none Level of assistance: Complete Independence Comments: no deviations   [facility] Adult PT Treatment:                                                DATE: 04-08-21 Therapeutic Exercise: Initial hip hinging at end of session KB ladder 5 x each with 15 #, 25 # and 45 # 2 rounds Then deadlift with 75#  5 lifts, then 3 min rest  then 5 lifts Sit to stand 15 lbs x 15  Manual Therapy:  MFR Bil QL and  STW bil lumbar sacral paraspinals IASTYM sacral scrubbing followed by Moist heat in prone to low back   Trigger Point Dry Needling Treatment: Pre-treatment instruction: Patient instructed on dry needling rationale, procedures, and possible side effects including pain during treatment (achy,cramping feeling), bruising, drop of blood, lightheadedness, nausea, sweating. Patient Consent Given: Yes Education handout provided: Previously provided Muscles treated:  Bil  QL R and L SI bil lumbar paraspinals Needle size and number:  100 mm .30 gage 50 mm .30gage Electrical stimulation performed: N/A Treatment response/outcome: Twitch response elicited, Palpable decrease in muscle tension, and increased motion with less pain Post-treatment instructions: Patient instructed to expect possible mild to moderate muscle soreness later today and/or tomorrow. Patient instructed in methods to reduce muscle soreness and to continue prescribed HEP. If patient was dry needled over the lung field, patient was instructed on signs and symptoms of pneumothorax and, however unlikely, to see immediate medical attention should they occur. Patient was also educated on signs and symptoms of infection and to seek medical attention should they occur. Patient verbalized understanding of these instructions and education.  [facility] Adult PT Treatment:                                                DATE: 04/02/21 Therapeutic Exercise: Recumbent bike L2 for  6 min  Supine Tr A with ball squeeze x 10 Supine stabilization Lat  pull down bilateral , 1 x 8 lbs wgt. In hook lying then x 10 in 90/90  Dead bug alt.  Knee extension with alt UE lift  Ball under sacrum for bent knee fall out alternating no band  Unilateral horizontal abd  x 10 x 6 lbs in hook lying with ball  Sit to stand 15 lbs x 15 Step up lateral x 10 ( 8 inch step) Standing hip abduction off step x 15 with neutral pelvis, cues  Reverse hinge off 8 inch step x 10 for hip extension Seated piriformis      [facility] Adult PT Treatment:                                                DATE: 03-25-21 Therapeutic Exercise: Supine Posterior Pelvic Tilt  2 x 10 with VC and TC , pt tends to exaggerate movement. VC to perform in pain free range Supine Transversus Abdominis Bracing 2 x 10 5 sec hold Bilateral and unilateral clam with ball under pelvis.  Post mobilization with no pain  Supine 2022/06/04 with ball x 10  Dead bug variation  with supine 06-04-22 only Initial hip hinging at end of session   Manual Therapy: Post TPDN pt supine. Pt locked on left SI, PA mob at ischial tuberosity with knee to chest (pt reSTW to R QL and gluteals) with pt decrease in pain and stiffness Spinal mobs in left sidelying T/L spine  with cavitation MFR Bil QL and  STW bil lumbar sacral paraspinals  Trigger Point Dry Needling Treatment: Pre-treatment instruction: Patient instructed on dry needling rationale, procedures, and possible side effects including pain during treatment (achy,cramping feeling), bruising, drop of blood, lightheadedness, nausea, sweating. Patient Consent Given: Yes Education handout provided: Previously provided Muscles treated:  left QL and Piriformis/ L  gluteals  R and L SI Needle size and number:  100 mm .30 gage 50 mm .30gage Electrical stimulation performed: yes Parameters: 20pps to pt tolerance  4 needles lumbar sacral for 10 min Treatment response/outcome: Twitch response elicited, Palpable decrease in muscle tension, and increased motion with less  pain Post-treatment instructions: Patient instructed to expect possible mild to moderate muscle soreness later today and/or tomorrow. Patient instructed in methods to reduce muscle soreness and to continue prescribed HEP. If patient was dry needled over the lung field, patient was instructed on signs and symptoms of pneumothorax and, however unlikely, to see immediate medical attention should they occur. Patient was also educated on signs and symptoms of infection and to seek medical attention should they occur. Patient verbalized understanding of these instructions and education.   [facility] Adult PT Treatment:                                                DATE: 03-17-21 Therapeutic Exercise: Supine Posterior Pelvic Tilt  2 x 10 with VC and TC extra time initially to perform correctly Supine Transversus Abdominis Bracing 2 x 10 5 sec hold Bilateral and unilateral clam with ball under pelvis.  Posterior tilt improves pain.   Supine Jun 04, 2022 with ball x 10  Dead bug variation  Hip adduction with  ball x 10 with bridge (painful) Iso hip abduction blue band x 10 with bridge, less pain   Manual Therapy: STW to R QL and gluteals Spinal mobs in left sidelying T/L spine  MFR Rt trunk   Cold pack 10 min hooklying to LS spine       Eval- TODAY'S TREATMENT  Pt eval, HEP for spine mobility, manual therapy gr I-II along lumbar spine.     PATIENT EDUCATION:  Education details: stabiliation Person educated: Patient Education method: Consulting civil engineer, Media planner, Verbal cues, and Handouts Education comprehension: verbalized understanding, returned demonstration, and needs further education     HOME EXERCISE PROGRAM: Access Code: [email or web address] URL: [email or web address] Date: 03/05/2021 Prepared by: Raeford Razor   ASSESSMENT:   CLINICAL IMPRESSION:  Patient is making progress, increased FOTO score and is on track to meet goals.  Pt was able to progress lifting and weights today without  exacerbating pain during session.  She continues ot have pain with riding horses.  She continues to have difficulty maintaining level pelvis in standing, has mild increase in back pain with flat back supine core work.  Pt consented to TPDN and was closely monitored today.  She was a 1/10 throughout session and at end today.     REHAB POTENTIAL: Excellent   CLINICAL DECISION MAKING: Evolving/moderate complexity   EVALUATION COMPLEXITY: Moderate     GOALS: Goals reviewed with patient? No       LONG TERM GOALS:    LTG Name Target Date Goal status  1 Pt will be able to transition out of bed with min occasional difficulty, pain in AM  Baseline: in progress trending towards met  04/30/2021 Ongoing   2 Pt will be able to stand at work with improved posture, pain and overall fatigue Baseline: improving  04/30/2021 Ongoing   3 Pt will be able to show independence with HEP for spine mobility, core strength  Baseline:up to date  04/30/2021 Ongoing   4 Pt will be able to squat , lift without increasing back pain  Baseline: in progress  04/30/2021 Ongoing   5 FOTO score ill improve to 66% able or better to demo improved functional mobilty  Baseline: 04/30/2021 Ongoing                       PLAN: PT FREQUENCY: 1x/week   PT DURATION: 8 weeks   PLANNED INTERVENTIONS: Therapeutic exercises, Therapeutic activity, Neuro Muscular re-education, Balance training, Gait training, Patient/Family education, Joint mobilization, Dry Needling, Electrical stimulation, Spinal mobilization, Cryotherapy, Moist heat, and Manual therapy   PLAN FOR NEXT SESSION:  Check lumbosacral movement Left to R comparison.  Less dissociation on Left lumbosacral area  reassess SIJ and review stabilization  . Voncille Lo, PT, [place] Certified Exercise Expert for the Aging Adult  04/08/21 4:51 PM Phone: [phone] Fax: [phone]

## 2021-04-08 ENCOUNTER — Other Ambulatory Visit: Payer: Self-pay

## 2021-04-08 ENCOUNTER — Encounter: Payer: Self-pay | Admitting: Physical Therapy

## 2021-04-08 ENCOUNTER — Ambulatory Visit: Payer: No Typology Code available for payment source | Admitting: Physical Therapy

## 2021-04-08 DIAGNOSIS — M5417 Radiculopathy, lumbosacral region: Secondary | ICD-10-CM

## 2021-04-08 DIAGNOSIS — M545 Low back pain, unspecified: Secondary | ICD-10-CM | POA: Diagnosis not present

## 2021-04-08 DIAGNOSIS — M2560 Stiffness of unspecified joint, not elsewhere classified: Secondary | ICD-10-CM

## 2021-04-08 DIAGNOSIS — G8929 Other chronic pain: Secondary | ICD-10-CM

## 2021-04-15 ENCOUNTER — Ambulatory Visit: Payer: No Typology Code available for payment source | Admitting: Physical Therapy

## 2021-04-15 ENCOUNTER — Other Ambulatory Visit: Payer: Self-pay

## 2021-04-15 ENCOUNTER — Encounter: Payer: Self-pay | Admitting: Physical Therapy

## 2021-04-15 DIAGNOSIS — M545 Low back pain, unspecified: Secondary | ICD-10-CM | POA: Diagnosis not present

## 2021-04-15 DIAGNOSIS — M5417 Radiculopathy, lumbosacral region: Secondary | ICD-10-CM

## 2021-04-15 DIAGNOSIS — M2560 Stiffness of unspecified joint, not elsewhere classified: Secondary | ICD-10-CM

## 2021-04-15 DIAGNOSIS — G8929 Other chronic pain: Secondary | ICD-10-CM

## 2021-04-15 NOTE — Therapy (Signed)
OUTPATIENT PHYSICAL THERAPY TREATMENT NOTE   Patient Name: Anne Haas MRN: [id number] DOB:1975/04/16, 46 y.o., female Today's Date: 04/15/2021  PCP: London Pepper, MD REFERRING PROVIDER: London Pepper, MD   PT End of Session - 04/15/21 1556     Visit Number 7    Number of Visits 8    Date for PT Re-Evaluation 04/30/21    Authorization Type [employer] employee    PT Start Time 1556    PT Stop Time 1640    PT Time Calculation (min) 44 min    Activity Tolerance Patient tolerated treatment well    Behavior During Therapy [facility] for tasks assessed/performed                 History reviewed. No pertinent past medical history. History reviewed. No pertinent surgical history. Patient Active Problem List   Diagnosis Date Noted   Bite, insect 08/04/2016    REFERRING DIAG:  M54.50 (ICD-10-CM) - Low back pain, unspecified  THERAPY DIAG:  Chronic midline low back pain without sciatica  Radiculopathy, lumbosacral region  Joint stiffness of spine  PERTINENT HISTORY: L sided low back 13 yrs ago had a fall directly on tailbone, suffered with LLE radicular sx at that time.   PRECAUTIONS: none  SUBJECTIVE:  I feel like I am making improvements but I still have back pain when I let me hips lean forward . Pain in same place in low mid back.  I think I felt better with the e stim  And TPDN   PAIN:            Are you having pain? None,  NPRS scale: 0/10 but does have pain with PA mobs over SI Pain location: low back central  Pain orientation: Bilateral R>L PAIN TYPE:  spasm, sharp on eval  and just sore now Pain description: sharp  Aggravating factors: AM,laying on back  Relieving factors: moving around around , weight shifting , coughing sneezingAre you having pain?      All objective findings taken on eval unless otherwise noted OBJECTIVE:    DIAGNOSTIC FINDINGS:  Mild degenerative changes without spinal canal stenosis or neural foraminal narrowing.   PATIENT SURVEYS:   FOTO 53%   SCREENING FOR RED FLAGS: Bowel or bladder incontinence: Yes: urinary leakage unrelated , stress incontinence.     COGNITION:          Overall cognitive status: Within functional limits for tasks assessed                        SENSATION:          Light touch: Appears intact          Stereognosis: Appears intact          Hot/Cold: Appears intact          Proprioception: Appears intact   MUSCLE LENGTH: Hamstrings: WFL     POSTURE:  No remarkable findings, somewhat decreased lumbar lordosis LLE longer in supine? ASIS appear level in supine and standing    PALPATION: Pain along L4-L5-S1 paraspinals and lateral SI border L > R .  Stiffness throughout thoracolumbar spine.    LUMBARAROM/PROM   A/PROM A/PROM  03/05/2021  Flexion 50% central stiffness  Extension 50% pain central   Right lateral flexion 25%  Left lateral flexion 25%  Right rotation 50%  Left rotation 50%   (Blank rows = not tested)   LE AROM/PROM:   A/PROM Right 03/05/2021 Left 03/05/2021  Hip flexion WFL throughout [facility]  Throughout   Hip extension      Hip abduction      Hip adduction      Hip internal rotation      Hip external rotation      Knee flexion      Knee extension      Ankle dorsiflexion      Ankle plantarflexion      Ankle inversion      Ankle eversion       (Blank rows = not tested)       LE MMT:   MMT Right 03/05/2021 Left 03/05/2021  Hip flexion 4/5 4+/5  Hip extension      Hip abduction 4+/5 4/5 pain   Hip adduction      Hip internal rotation      Hip external rotation      Knee flexion 5/5 5/5  Knee extension 5/5 5/5  Ankle dorsiflexion 5/5 5/5  Ankle plantarflexion      Ankle inversion      Ankle eversion       (Blank rows = not tested)   LUMBAR SPECIAL TESTS:  Single leg stance test: L trendelenburg.  Neg Slump, Neg SLR. Pos. Prone knee flexion. Compression to  SI improved pain    FUNCTIONAL TESTS:    NT NT GAIT: Distance walked: 150 Assistive  device utilized: none Level of assistance: Complete Independence Comments: no deviations  [facility] Adult PT Treatment:                                                DATE: 04-15-21 Therapeutic Exercise: Initial hip hinging at end of session KB ladder 5 x each with 25 #, and 45 # 2 rounds Then deadlift with 65#  5 lifts, then 3 min rest  then 5 lifts Sit to stand 15 lbs x 10  Manual Therapy: STW bil lumbar sacral paraspinals Spinal mobs in left sidelying T/L spine  with cavitation IASTYM sacral scrubbing   Trigger Point Dry Needling Treatment: Pre-treatment instruction: Patient instructed on dry needling rationale, procedures, and possible side effects including pain during treatment (achy,cramping feeling), bruising, drop of blood, lightheadedness, nausea, sweating. Patient Consent Given: Yes Education handout provided: Previously provided Muscles treated: lumbar SI paraspinals bil  Needle size and number: .25x40m x 4 Electrical stimulation performed: Yes Parameters:  2 0 pps and to pt tolerance Treatment response/outcome: Twitch response elicited and Palpable decrease in muscle tension Post-treatment instructions: Patient instructed to expect possible mild to moderate muscle soreness later today and/or tomorrow. Patient instructed in methods to reduce muscle soreness and to continue prescribed HEP. If patient was dry needled over the lung field, patient was instructed on signs and symptoms of pneumothorax and, however unlikely, to see immediate medical attention should they occur. Patient was also educated on signs and symptoms of infection and to seek medical attention should they occur. Patient verbalized understanding of these instructions and education.  [facility]                                                DATE: 04-[facility]-21 Therapeutic Exercise: Initial hip hinging at end of session KB ladder 5 x each with  15 #, 25 # and 45 # 2 rounds Then deadlift with 75#  5 lifts, then  3 min rest  then 5 lifts Sit to stand 15 lbs x 15  Manual Therapy:  MFR Bil QL and  STW bil lumbar sacral paraspinals IASTYM sacral scrubbing followed by Moist heat in prone to low back   Trigger Point Dry Needling Treatment: Pre-treatment instruction: Patient instructed on dry needling rationale, procedures, and possible side effects including pain during treatment (achy,cramping feeling), bruising, drop of blood, lightheadedness, nausea, sweating. Patient Consent Given: Yes Education handout provided: Previously provided Muscles treated:  Bil  QL R and L SI bil lumbar paraspinals Needle size and number:  100 mm .30 gage 50 mm .30gage Electrical stimulation performed: N/A Treatment response/outcome: Twitch response elicited, Palpable decrease in muscle tension, and increased motion with less pain Post-treatment instructions: Patient instructed to expect possible mild to moderate muscle soreness later today and/or tomorrow. Patient instructed in methods to reduce muscle soreness and to continue prescribed HEP. If patient was dry needled over the lung field, patient was instructed on signs and symptoms of pneumothorax and, however unlikely, to see immediate medical attention should they occur. Patient was also educated on signs and symptoms of infection and to seek medical attention should they occur. Patient verbalized understanding of these instructions and education.  [facility] Adult PT Treatment:                                                DATE: 04/02/21 Therapeutic Exercise: Recumbent bike L2 for 6 min  Supine Tr A with ball squeeze x 10 Supine stabilization Lat pull down bilateral , 1 x 8 lbs wgt. In hook lying then x 10 in 90/90  Dead bug alt.  Knee extension with alt UE lift  Ball under sacrum for bent knee fall out alternating no band  Unilateral horizontal abd  x 10 x 6 lbs in hook lying with ball  Sit to stand 15 lbs x 15 Step up lateral x 10 ( 8 inch step) Standing hip abduction  off step x 15 with neutral pelvis, cues  Reverse hinge off 8 inch step x 10 for hip extension Seated piriformis      [facility] Adult PT Treatment:                                                DATE: 03-25-21 Therapeutic Exercise: Supine Posterior Pelvic Tilt  2 x 10 with VC and TC , pt tends to exaggerate movement. VC to perform in pain free range Supine Transversus Abdominis Bracing 2 x 10 5 sec hold Bilateral and unilateral clam with ball under pelvis.  Post mobilization with no pain  Supine 2022-05-13 with ball x 10  Dead bug variation  with supine 05-13-22 only Initial hip hinging at end of session   Manual Therapy: Post TPDN pt supine. Pt locked on left SI, PA mob at ischial tuberosity with knee to chest (pt reSTW to R QL and gluteals) with pt decrease in pain and stiffness Spinal mobs in left sidelying T/L spine  with cavitation MFR Bil QL and  STW bil lumbar sacral paraspinals  Trigger Point Dry Needling Treatment: Pre-treatment instruction: Patient instructed on  dry needling rationale, procedures, and possible side effects including pain during treatment (achy,cramping feeling), bruising, drop of blood, lightheadedness, nausea, sweating. Patient Consent Given: Yes Education handout provided: Previously provided Muscles treated:  left QL and Piriformis/ L  gluteals  R and L SI Needle size and number:  100 mm .30 gage 50 mm .30gage Electrical stimulation performed: yes Parameters: 20pps to pt tolerance  4 needles lumbar sacral for 10 min Treatment response/outcome: Twitch response elicited, Palpable decrease in muscle tension, and increased motion with less pain Post-treatment instructions: Patient instructed to expect possible mild to moderate muscle soreness later today and/or tomorrow. Patient instructed in methods to reduce muscle soreness and to continue prescribed HEP. If patient was dry needled over the lung field, patient was instructed on signs and symptoms of pneumothorax and,  however unlikely, to see immediate medical attention should they occur. Patient was also educated on signs and symptoms of infection and to seek medical attention should they occur. Patient verbalized understanding of these instructions and education.   [facility] Adult PT Treatment:                                                DATE: 03-17-21 Therapeutic Exercise: Supine Posterior Pelvic Tilt  2 x 10 with VC and TC extra time initially to perform correctly Supine Transversus Abdominis Bracing 2 x 10 5 sec hold Bilateral and unilateral clam with ball under pelvis.  Posterior tilt improves pain.   Supine 2022-05-06 with ball x 10  Dead bug variation  Hip adduction with ball x 10 with bridge (painful) Iso hip abduction blue band x 10 with bridge, less pain   Manual Therapy: STW to R QL and gluteals Spinal mobs in left sidelying T/L spine  MFR Rt trunk   Cold pack 10 min hooklying to LS spine       Eval- TODAY'S TREATMENT  Pt eval, HEP for spine mobility, manual therapy gr I-II along lumbar spine.     PATIENT EDUCATION:  Education details: stabiliation Person educated: Patient Education method: Consulting civil engineer, Media planner, Verbal cues, and Handouts Education comprehension: verbalized understanding, returned demonstration, and needs further education     HOME EXERCISE PROGRAM: Access Code: [email or web address] URL: [email or web address] Date: 03/05/2021 Prepared by: Raeford Razor   ASSESSMENT:   CLINICAL IMPRESSION:   Pt was able to progress lifting and weights today with deadlifting 65 lb  without exacerbating pain during session.  She continues to have minimal pain with riding horses and notes better movement in pelvis attributed to having increased hip disssociation.    Pt was 0/10 but did rise to 1/10 with movement and lifting. Pt may benefit from increasing weight for progressive overload and building reserve for horse back riding and lifting heavier things.  Pt plan for DC next visit  if goals partially met or achieved     REHAB POTENTIAL: Excellent   CLINICAL DECISION MAKING: Evolving/moderate complexity   EVALUATION COMPLEXITY: Moderate     GOALS: Goals reviewed with patient? No       LONG TERM GOALS:    LTG Name Target Date Goal status  1 Pt will be able to transition out of bed with min occasional difficulty, pain in AM  Baseline: in progress trending towards met  04/30/2021 ACHIEVED  2 Pt will be able to stand at work with improved posture, pain and overall fatigue  Baseline: improving  04/30/2021 Ongoing   3 Pt will be able to show independence with HEP for spine mobility, core strength  Baseline:up to date  04/30/2021 Ongoing   4 Pt will be able to squat , lift without increasing back pain  Baseline: in progress  04/30/2021 Ongoing   5 FOTO score ill improve to 66% able or better to demo improved functional mobilty  Baseline: 04/30/2021 Ongoing                       PLAN: PT FREQUENCY: 1x/week   PT DURATION: 8 weeks   PLANNED INTERVENTIONS: Therapeutic exercises, Therapeutic activity, Neuro Muscular re-education, Balance training, Gait training, Patient/Family education, Joint mobilization, Dry Needling, Electrical stimulation, Spinal mobilization, Cryotherapy, Moist heat, and Manual therapy   PLAN FOR NEXT SESSION:   DC this visit.  FOTO. Check lumbosacral movement Left to R comparison.  Less dissociation on Left lumbosacral area  reassess SIJ and review stabilization   Voncille Lo, PT, [place] Certified Exercise Expert for the Aging Adult  04/15/21 8:18 PM Phone: [phone] Fax: [phone]

## 2021-04-28 NOTE — Therapy (Signed)
OUTPATIENT PHYSICAL THERAPY TREATMENT NOTE/   Patient Name: Anne Haas MRN: [id number] DOB:May 18, 1975, 46 y.o., female Today's Date: 04/29/2021 PHYSICAL THERAPY DISCHARGE SUMMARY  Visits from Start of Care: 8  Current functional level related to goals / functional outcomes: As below   Remaining deficits: Pt does have tightness in lower back but she does have HEP to address at home   Education / Equipment: HEP , TENS infomation   Patient agrees to discharge. Patient goals were met. Patient is being discharged due to meeting the stated rehab goals.  PCP: London Pepper, MD REFERRING PROVIDER: Consuella Lose, MD   PT End of Session - 04/29/21 1511     Visit Number 8    Number of Visits 8    Date for PT Re-Evaluation 04/30/21    Authorization Type [employer] employee    PT Start Time 1500    PT Stop Time 1549    PT Time Calculation (min) 49 min    Activity Tolerance Patient tolerated treatment well    Behavior During Therapy [facility] for tasks assessed/performed                  No past medical history on file. No past surgical history on file. Patient Active Problem List   Diagnosis Date Noted   Bite, insect 08/04/2016    REFERRING DIAG:  M54.50 (ICD-10-CM) - Low back pain, unspecified  THERAPY DIAG:  Chronic midline low back pain without sciatica  Radiculopathy, lumbosacral region  Joint stiffness of spine  PERTINENT HISTORY: L sided low back 13 yrs ago had a fall directly on tailbone, suffered with LLE radicular sx at that time.   PRECAUTIONS: none  SUBJECTIVE:  I have been lifting more heavy items. Like wood off my porch and I was sore but I was able to do it better this time.  The sharp pain has gotten less severe. I have been trying to ride my horse    PAIN:            Are you having pain? None,  at times I do have stiffness but I know what to do NPRS scale: 0/10 but does have pain with PA mobs over SI Pain location: low back central  Pain  orientation: Bilateral R>L PAIN TYPE:  spasm, sharp on eval  and just sore now Pain description: sharp  Aggravating factors: AM,laying on back  Relieving factors: moving around around , weight shifting , coughing sneezingAre you having pain?      All objective findings taken on eval unless otherwise noted OBJECTIVE:    DIAGNOSTIC FINDINGS:  Mild degenerative changes without spinal canal stenosis or neural foraminal narrowing.   PATIENT SURVEYS:  FOTO 53% 04-29-21 74%    SCREENING FOR RED FLAGS: Bowel or bladder incontinence: Yes: urinary leakage unrelated , stress incontinence.     COGNITION:          Overall cognitive status: Within functional limits for tasks assessed                        SENSATION:          Light touch: Appears intact          Stereognosis: Appears intact          Hot/Cold: Appears intact          Proprioception: Appears intact   MUSCLE LENGTH: Hamstrings: WFL     POSTURE:  No remarkable findings, somewhat decreased lumbar lordosis LLE  longer in supine? ASIS appear level in supine and standing    PALPATION: Pain along L4-L5-S1 paraspinals and lateral SI border L > R .  Stiffness throughout thoracolumbar spine.    LUMBARAROM/PROM   A/PROM A/PROM  03/05/2021 A/PROM 04-29-21  Flexion 50% central stiffness Finger tips to mid shin  Extension 50% pain central  Central stiffness 75%  Right lateral flexion 25% Fingertips to knee  Left lateral flexion 25% Finger tip to knee  Right rotation 50% 75%  Left rotation 50% 75%   (Blank rows = not tested)   LE AROM/PROM:   A/PROM Right 03/05/2021 Left 03/05/2021  Hip flexion WFL throughout [facility]  Throughout   Hip extension      Hip abduction      Hip adduction      Hip internal rotation      Hip external rotation      Knee flexion      Knee extension      Ankle dorsiflexion      Ankle plantarflexion      Ankle inversion      Ankle eversion       (Blank rows = not tested)       LE MMT:  04-29-21    All MMT 5/5 post RX    MMT Right 03/05/2021 Left 03/05/2021  Hip flexion 4/5 4+/5  Hip extension      Hip abduction 4+/5 4/5 pain   Hip adduction      Hip internal rotation      Hip external rotation      Knee flexion 5/5 5/5  Knee extension 5/5 5/5  Ankle dorsiflexion 5/5 5/5  Ankle plantarflexion      Ankle inversion      Ankle eversion       (Blank rows = not tested)   LUMBAR SPECIAL TESTS:  Single leg stance test: L trendelenburg.  Neg Slump, Neg SLR. Pos. Prone knee flexion. Compression to  SI improved pain    FUNCTIONAL TESTS:    NT NT GAIT: Distance walked: 150 Assistive device utilized: none Level of assistance: Complete Independence Comments: no deviations    [facility] Adult PT Treatment:                                                DATE: 04-29-21 Self Care-  Reviewed HEP and answered questions for home use and self care Reviewed lifting/ hip hinge TENS unit education and information  Manual Therapy: STW bil lumbar sacral paraspinals Spinal mobs in left sidelying T/L spine      Trigger Point Dry Needling Treatment: Pre-treatment instruction: Patient instructed on dry needling rationale, procedures, and possible side effects including pain during treatment (achy,cramping feeling), bruising, drop of blood, lightheadedness, nausea, sweating. Patient Consent Given: Yes Education handout provided: Previously provided Muscles treated: lumbar SI paraspinals bil  R L 5 S-1TPDN Needle size and number: .25x49mm x 4 Electrical stimulation performed: Yes Parameters: 20 pps and to pt tolerance Treatment response/outcome: Twitch response elicited and Palpable decrease in muscle tension Post-treatment instructions: Patient instructed to expect possible mild to moderate muscle soreness later today and/or tomorrow. Patient instructed in methods to reduce muscle soreness and to continue prescribed HEP. If patient was dry needled over the lung field, patient was instructed on  signs and symptoms of pneumothorax and, however unlikely, to see immediate medical  attention should they occur. Patient was also educated on signs and symptoms of infection and to seek medical attention should they occur. Patient verbalized understanding of these instructions and education.   [facility] Adult PT Treatment:                                                DATE: 04-15-21 Therapeutic Exercise: Initial hip hinging at end of session KB ladder 5 x each with 25 #, and 45 # 2 rounds Then deadlift with 65#  5 lifts, then 3 min rest  then 5 lifts Sit to stand 15 lbs x 10  Manual Therapy: STW bil lumbar sacral paraspinals Spinal mobs in left sidelying T/L spine  with cavitation IASTYM sacral scrubbing   Trigger Point Dry Needling Treatment: Pre-treatment instruction: Patient instructed on dry needling rationale, procedures, and possible side effects including pain during treatment (achy,cramping feeling), bruising, drop of blood, lightheadedness, nausea, sweating. Patient Consent Given: Yes Education handout provided: Previously provided Muscles treated: lumbar SI paraspinals bil  Needle size and number: .25x63mm x 4 Electrical stimulation performed: Yes Parameters:  2 0 pps and to pt tolerance Treatment response/outcome: Twitch response elicited and Palpable decrease in muscle tension Post-treatment instructions: Patient instructed to expect possible mild to moderate muscle soreness later today and/or tomorrow. Patient instructed in methods to reduce muscle soreness and to continue prescribed HEP. If patient was dry needled over the lung field, patient was instructed on signs and symptoms of pneumothorax and, however unlikely, to see immediate medical attention should they occur. Patient was also educated on signs and symptoms of infection and to seek medical attention should they occur. Patient verbalized understanding of these instructions and education.  [facility] Adult PT Treatment:                                                 DATE: 04-08-21 Therapeutic Exercise: Initial hip hinging at end of session KB ladder 5 x each with 15 #, 25 # and 45 # 2 rounds Then deadlift with 75#  5 lifts, then 3 min rest  then 5 lifts Sit to stand 15 lbs x 15  Manual Therapy:  MFR Bil QL and  STW bil lumbar sacral paraspinals IASTYM sacral scrubbing followed by Moist heat in prone to low back   Trigger Point Dry Needling Treatment: Pre-treatment instruction: Patient instructed on dry needling rationale, procedures, and possible side effects including pain during treatment (achy,cramping feeling), bruising, drop of blood, lightheadedness, nausea, sweating. Patient Consent Given: Yes Education handout provided: Previously provided Muscles treated:  Bil  QL R and L SI bil lumbar paraspinals Needle size and number:  100 mm .30 gage 50 mm .30gage Electrical stimulation performed: N/A Treatment response/outcome: Twitch response elicited, Palpable decrease in muscle tension, and increased motion with less pain Post-treatment instructions: Patient instructed to expect possible mild to moderate muscle soreness later today and/or tomorrow. Patient instructed in methods to reduce muscle soreness and to continue prescribed HEP. If patient was dry needled over the lung field, patient was instructed on signs and symptoms of pneumothorax and, however unlikely, to see immediate medical attention should they occur. Patient was also educated on signs and symptoms of infection and to seek medical  attention should they occur. Patient verbalized understanding of these instructions and education.  [facility] Adult PT Treatment:                                                DATE: 04/02/21 Therapeutic Exercise: Recumbent bike L2 for 6 min  Supine Tr A with ball squeeze x 10 Supine stabilization Lat pull down bilateral , 1 x 8 lbs wgt. In hook lying then x 10 in 90/90  Dead bug alt.  Knee extension with alt UE lift  Ball under  sacrum for bent knee fall out alternating no band  Unilateral horizontal abd  x 10 x 6 lbs in hook lying with ball  Sit to stand 15 lbs x 15 Step up lateral x 10 ( 8 inch step) Standing hip abduction off step x 15 with neutral pelvis, cues  Reverse hinge off 8 inch step x 10 for hip extension Seated piriformis      [facility] Adult PT Treatment:                                                DATE: 03-25-21 Therapeutic Exercise: Supine Posterior Pelvic Tilt  2 x 10 with VC and TC , pt tends to exaggerate movement. VC to perform in pain free range Supine Transversus Abdominis Bracing 2 x 10 5 sec hold Bilateral and unilateral clam with ball under pelvis.  Post mobilization with no pain  Supine 2022-05-21 with ball x 10  Dead bug variation  with supine 2022-05-21 only Initial hip hinging at end of session   Manual Therapy: Post TPDN pt supine. Pt locked on left SI, PA mob at ischial tuberosity with knee to chest (pt reSTW to R QL and gluteals) with pt decrease in pain and stiffness Spinal mobs in left sidelying T/L spine  with cavitation MFR Bil QL and  STW bil lumbar sacral paraspinals  Trigger Point Dry Needling Treatment: Pre-treatment instruction: Patient instructed on dry needling rationale, procedures, and possible side effects including pain during treatment (achy,cramping feeling), bruising, drop of blood, lightheadedness, nausea, sweating. Patient Consent Given: Yes Education handout provided: Previously provided Muscles treated:  left QL and Piriformis/ L  gluteals  R and L SI Needle size and number:  100 mm .30 gage 50 mm .30gage Electrical stimulation performed: yes Parameters: 20pps to pt tolerance  4 needles lumbar sacral for 10 min Treatment response/outcome: Twitch response elicited, Palpable decrease in muscle tension, and increased motion with less pain Post-treatment instructions: Patient instructed to expect possible mild to moderate muscle soreness later today and/or tomorrow.  Patient instructed in methods to reduce muscle soreness and to continue prescribed HEP. If patient was dry needled over the lung field, patient was instructed on signs and symptoms of pneumothorax and, however unlikely, to see immediate medical attention should they occur. Patient was also educated on signs and symptoms of infection and to seek medical attention should they occur. Patient verbalized understanding of these instructions and education.   [facility] Adult PT Treatment:  DATE: 03-17-21 Therapeutic Exercise: Supine Posterior Pelvic Tilt  2 x 10 with VC and TC extra time initially to perform correctly Supine Transversus Abdominis Bracing 2 x 10 5 sec hold Bilateral and unilateral clam with ball under pelvis.  Posterior tilt improves pain.   Supine 2022/05/11 with ball x 10  Dead bug variation  Hip adduction with ball x 10 with bridge (painful) Iso hip abduction blue band x 10 with bridge, less pain   Manual Therapy: STW to R QL and gluteals Spinal mobs in left sidelying T/L spine  MFR Rt trunk   Cold pack 10 min hooklying to LS spine       Eval- TODAY'S TREATMENT  Pt eval, HEP for spine mobility, manual therapy gr I-II along lumbar spine.     PATIENT EDUCATION:  Education details: stabiliation HEP and TENS Unit suggestion Person educated: Patient Education method: Consulting civil engineer, Demonstration, Verbal cues, and Handouts Education comprehension: verbalized understanding, returned demonstration, and needs further education     HOME EXERCISE PROGRAM: Access Code: [email or web address] URL: [email or web address] Date: 03/05/2021 Prepared by: Raeford Razor   ASSESSMENT:   CLINICAL IMPRESSION:  Ms Earnhardt was able to progress lifting and weights in past with deadlifting 65 lb  without exacerbating pain during session.  Today pt was able achieve all LTG.  FOTO improved to 74%.  She continues to have minimal pain with riding horses and notes  better movement in pelvis attributed to having increased hip disssociation.    Pt was 0/10  today. 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 Pt was given education on TENS unit since pt seems to do better after TPDN with estim. Pt  states she is able to carry items from porch to barn.  Ms Ceniceros does report that she is protective of her back and limits bending /flexing forward.  Pt has achieved all LTG and is ready for DC     REHAB POTENTIAL: Excellent   CLINICAL DECISION MAKING: Evolving/moderate complexity   EVALUATION COMPLEXITY: Moderate     GOALS: Goals reviewed with patient? No       LONG TERM GOALS:    LTG Name Target Date Goal status  1 Pt will be able to transition out of bed with min occasional difficulty, pain in AM  Baseline: in progress trending towards met  04/30/2021 ACHIEVED  2 Pt will be able to stand at work with improved posture, pain and overall fatigue Baseline: improving  05-10-21 pt able to carry out full work day and duties 04/30/2021 ACHIEVED  3 Pt will be able to show independence with HEP for spine mobility, core strength  Baseline:up to date  04/30/2021 ACHIEVED  4 Pt will be able to squat , lift without increasing back pain  Baseline: in progress  Pt able to lift items on porch to barn 04/30/2021 ACHIEVED  5 FOTO score ill improve to 66% able or better to demo improved functional mobilty  Baseline: DC 74% 04/30/2021 Achieved                      PLAN: PT FREQUENCY: 1x/week   PT DURATION: 8 weeks   PLANNED INTERVENTIONS: Therapeutic exercises, Therapeutic activity, Neuro Muscular re-education, Balance training, Gait training, Patient/Family education, Joint mobilization, Dry Needling, Electrical stimulation, Spinal mobilization, Cryotherapy, Moist heat, and Manual therapy   PLAN FOR NEXT SESSION:   DC this visit.    Voncille Lo, PT, [place] Certified Exercise Expert for the Aging Adult  05-10-2021  4:57 PM Phone:  [phone] Fax: [phone]

## 2021-04-29 ENCOUNTER — Ambulatory Visit: Payer: No Typology Code available for payment source | Attending: Neurosurgery | Admitting: Physical Therapy

## 2021-04-29 ENCOUNTER — Other Ambulatory Visit: Payer: Self-pay

## 2021-04-29 DIAGNOSIS — G8929 Other chronic pain: Secondary | ICD-10-CM | POA: Diagnosis present

## 2021-04-29 DIAGNOSIS — M2560 Stiffness of unspecified joint, not elsewhere classified: Secondary | ICD-10-CM | POA: Insufficient documentation

## 2021-04-29 DIAGNOSIS — M545 Low back pain, unspecified: Secondary | ICD-10-CM | POA: Insufficient documentation

## 2021-04-29 DIAGNOSIS — M5417 Radiculopathy, lumbosacral region: Secondary | ICD-10-CM | POA: Diagnosis present

## 2021-04-29 NOTE — Patient Instructions (Signed)
TENS UNIT ? ?This is helpful for muscle pain and spasm.  ? ?Search and Purchase a TENS 7000 2nd edition at www.tenspros.com or www.amazon.com  (It should be less than $30)  ? ? ? ?TENS unit instructions:  ?Do not shower or bathe with the unit on ?Turn the unit off before removing electrodes or batteries ?If the electrodes lose stickiness add a drop of water to the electrodes after they are disconnected from the unit and place on plastic sheet. If you continued to have difficulty, call the TENS unit company to purchase more electrodes. ?Do not apply lotion on the skin area prior to use. Make sure the skin is clean and dry as this will help prolong the life of the electrodes. ?After use, always check skin for unusual red areas, rash or other skin difficulties. If there are any skin problems, does not apply electrodes to the same area. ?Never remove the electrodes from the unit by pulling the wires. ?Do not use the TENS unit or electrodes other than as directed. ?Do not change electrode placement without consulting your therapist or physician. ?Keep 2 fingers with between each electrode.  ?Voncille Lo, PT, [place] ?Certified Exercise Expert for the Aging Adult  ?04/29/21 3:22 PM ?Phone: [phone] ?Fax: [phone]  ?

## 2021-05-05 ENCOUNTER — Telehealth: Payer: Self-pay

## 2021-05-05 NOTE — Telephone Encounter (Signed)
Patient called in voice mail stating she had felt a "knot in her left breast" and would like a mammogram. I called her back and per DPR access note on file I left detailed message that she will need to schedule visit for breast exam and then provider will order diagnostic imaging. Asked her to call back. ?

## 2021-05-06 ENCOUNTER — Other Ambulatory Visit: Payer: Self-pay

## 2021-05-06 ENCOUNTER — Ambulatory Visit: Payer: No Typology Code available for payment source | Admitting: Radiology

## 2021-05-06 ENCOUNTER — Telehealth: Payer: Self-pay

## 2021-05-06 ENCOUNTER — Encounter: Payer: Self-pay | Admitting: Radiology

## 2021-05-06 VITALS — BP 134/82

## 2021-05-06 DIAGNOSIS — N6321 Unspecified lump in the left breast, upper outer quadrant: Secondary | ICD-10-CM | POA: Diagnosis not present

## 2021-05-06 NOTE — Telephone Encounter (Signed)
FYI. ? ?First available appt date and time was 05/28/21 @ 7:40AM.  ? ?Pt notified of appt date/time. Advised she can call daily before then and see if there are any cancellations. Pt voiced understanding.  ?

## 2021-05-06 NOTE — Telephone Encounter (Signed)
Anne Dory, NP  P [facility] Triage ?Please send pt to [facility] for diagnostic mammo and ultrasound. Left breast mass 1 o'clock 3cm, nonmobile, tender.  ?

## 2021-05-06 NOTE — Progress Notes (Signed)
? ?  Anne Haas [birth year] [id number] ? ? ?History:  46 y.o. presents with complaints of breast mass. ? ?Gynecologic History ?No LMP recorded. (Menstrual status: IUD). ?  ?Contraception/Family planning: IUD ?Last mammogram: 8/22. Results were: normal ? ?Obstetric History ?OB History  ?Gravida Para Term Preterm AB Living  ?1 0     1 0  ?SAB IAB Ectopic Multiple Live Births  ?           ?  ?# Outcome Date GA Lbr Len/2nd Weight Sex Delivery Anes PTL Lv  ?1 AB           ? ? ? ?The following portions of the patient's history were reviewed and updated as appropriate: allergies, current medications, past family history, past medical history, past social history, past surgical history, and problem list. ? ?Review of Systems ?Pertinent items noted in HPI and remainder of comprehensive ROS otherwise negative.  ? ?Past medical history, past surgical history, family history and social history were all reviewed and documented in the EPIC chart. ? ? ?Exam: ? ?Vitals:  ? 05/06/21 1119  ?BP: 134/82  ? ?There is no height or weight on file to calculate BMI. ? ?General appearance:  Normal ?Thyroid:  Symmetrical, normal in size, without palpable masses or nodularity. ?Respiratory ? Auscultation:  Clear without wheezing or rhonchi ?Cardiovascular ? Auscultation:  Regular rate, without rubs, murmurs or gallops ? Edema/varicosities:  Not grossly evident ?Breasts: right breast normal without mass, skin or nipple changes or axillary nodes.  ?Left breast there is a 3cm nonmobile tender mass present at 1 o'clock, no axillary enlargement palpated, no skin change ? ?Patient informed chaperone available to be present for breast exam. Patient has requested no chaperone to be present.  ? ?Assessment/Plan:   ? ?Mass of upper outer quadrant of left breast  ?Diagnostic mammo with ultrasound ordered ? ?Rubbie Battiest B WHNP-BC 11:45 AM 05/06/2021  ?

## 2021-05-10 NOTE — Telephone Encounter (Signed)
Patient saw Wende Crease, NP on 05/06/21. ?

## 2021-05-12 ENCOUNTER — Ambulatory Visit
Admission: RE | Admit: 2021-05-12 | Discharge: 2021-05-12 | Disposition: A | Discharge status: Home / Self Care | Payer: No Typology Code available for payment source | Source: Ambulatory Visit | Attending: Radiology | Admitting: Radiology

## 2021-05-12 ENCOUNTER — Other Ambulatory Visit: Payer: No Typology Code available for payment source

## 2021-05-12 DIAGNOSIS — N6321 Unspecified lump in the left breast, upper outer quadrant: Secondary | ICD-10-CM

## 2021-05-28 ENCOUNTER — Other Ambulatory Visit: Payer: No Typology Code available for payment source

## 2021-06-01 ENCOUNTER — Ambulatory Visit (HOSPITAL_COMMUNITY)
Admission: EM | Admit: 2021-06-01 | Discharge: 2021-06-01 | Disposition: A | Discharge status: Home / Self Care | Payer: No Typology Code available for payment source | Attending: Nurse Practitioner | Admitting: Nurse Practitioner

## 2021-06-01 ENCOUNTER — Encounter (HOSPITAL_COMMUNITY): Payer: Self-pay | Admitting: Emergency Medicine

## 2021-06-01 DIAGNOSIS — T7840XA Allergy, unspecified, initial encounter: Secondary | ICD-10-CM

## 2021-06-01 DIAGNOSIS — L509 Urticaria, unspecified: Secondary | ICD-10-CM

## 2021-06-01 MED ORDER — PREDNISONE 10 MG PO TABS: 20.0000 mg | ORAL_TABLET | Freq: Every day | ORAL | 0 refills | Status: AC

## 2021-06-01 MED ORDER — METHYLPREDNISOLONE SODIUM SUCC 125 MG IJ SOLR
INTRAMUSCULAR | Status: AC
  Filled 2021-06-01: qty 2

## 2021-06-01 MED ORDER — ALBUTEROL SULFATE HFA 108 (90 BASE) MCG/ACT IN AERS
2.0000 | INHALATION_SPRAY | Freq: Four times a day (QID) | RESPIRATORY_TRACT | 2 refills | Status: DC | PRN
Start: 2021-06-01 — End: 2021-08-04

## 2021-06-01 MED ORDER — FLUTICASONE PROPIONATE 50 MCG/ACT NA SUSP: 2.0000 | Freq: Every day | NASAL | 0 refills | Status: DC

## 2021-06-01 MED ORDER — EPINEPHRINE 0.3 MG/0.3ML IJ SOAJ: 0.3000 mg | Freq: Once | INTRAMUSCULAR | 0 refills | Status: AC

## 2021-06-01 MED ORDER — METHYLPREDNISOLONE SODIUM SUCC 125 MG IJ SOLR
125.0000 mg | Freq: Once | INTRAMUSCULAR | Status: AC
  Administered 2021-06-01: 125 mg via INTRAMUSCULAR

## 2021-06-01 MED ORDER — CETIRIZINE HCL 10 MG PO TABS
10.0000 mg | ORAL_TABLET | Freq: Every day | ORAL | 0 refills | Status: DC
Start: 2021-06-01 — End: 2021-08-04

## 2021-06-01 NOTE — ED Provider Notes (Signed)
?[place] ? ? ? ?CSN: [id number] ?Arrival date & time: 06/01/21  1508 ? ? ?  ? ?History   ?Chief Complaint ?Chief Complaint  ?Patient presents with  ? Allergic Reaction  ?  Currently having facial rash and lip swelling, chills, cough that progresses to laryngospasm. - Entered by patient  ? Rash  ? ? ?HPI ?Anne Haas is a 46 y.o. female.  ? ?The patient is a 46 year old female who presents for facial swelling, lip swelling, nasal congestion, and cough.  Patient states approximately 2 weeks ago she had some painting done in her home, which was done using spray paint.  She noticed that afterwards she developed a slight cough and some nasal congestion.  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  She states she did not notice any immediate reaction.  She continues to have facial swelling, lip swelling, nasal congestion, and a cough that "goes into laryngospasm".  The patient admits to a history of seasonal allergies.  She states that when she is coughing, she has shortness of breath, and difficulty taking in air.  She states that she also has noticed that she has had more postnasal drainage and she feels like that sometimes triggers her symptoms.  She states that the lip swelling, and rash swelling to her face have improved since earlier today.  She states the rash to her face is itchy.  She denies new medications, soaps, foods, or detergents.  She has been taking Benadryl but states little to no relief with that medication.  She is unsure of what possibly could be triggering her symptoms, but is concerned and would also like an EpiPen today. ? ?The history is provided by the patient.  ?Allergic Reaction ?Presenting symptoms: difficulty breathing, itching, rash (face), swelling and wheezing   ?Presenting symptoms: no difficulty swallowing   ?Prior allergic episodes:  Seasonal allergies ?Rash ?Associated symptoms: shortness of  breath, sore throat and wheezing   ? ?History reviewed. No pertinent past medical history. ? ?Patient Active Problem List  ? Diagnosis Date Noted  ? Bite, insect 08/04/2016  ? ? ?History reviewed. No pertinent surgical history. ? ?OB History   ? ? Gravida  ?1  ? Para  ?0  ? Term  ?   ? Preterm  ?   ? AB  ?1  ? Living  ?0  ?  ? ? SAB  ?   ? IAB  ?   ? Ectopic  ?   ? Multiple  ?   ? Live Births  ?   ?   ?  ?  ? ? ? ?Home Medications   ? ?Prior to Admission medications   ?Medication Sig Start Date End Date Taking? Authorizing Provider  ?albuterol (VENTOLIN HFA) 108 (90 Base) MCG/ACT inhaler Inhale 2 puffs into the lungs every 6 (six) hours as needed for wheezing or shortness of breath. 06/01/21  Yes Jillene Wehrenberg-Warren, Alda Lea, NP  ?cetirizine (ZYRTEC) 10 MG tablet Take 1 tablet (10 mg total) by mouth daily. 06/01/21  Yes Nashley Cordoba-Warren, Alda Lea, NP  ?EPINEPHrine 0.3 mg/0.3 mL IJ SOAJ injection Inject 0.3 mg into the muscle once for 1 dose. 06/01/21 06/01/21 Yes Ladaja Yusupov-Warren, Alda Lea, NP  ?fluticasone (FLONASE) 50 MCG/ACT nasal spray Place 2 sprays into both nostrils daily. 06/01/21  Yes Demarquis Osley-Warren, Alda Lea, NP  ?predniSONE (DELTASONE) 10 MG tablet Take 2 tablets (20 mg total) by  mouth daily for 5 days. 06/01/21 06/06/21 Yes Qamar Aughenbaugh-Warren, Alda Lea, NP  ?levonorgestrel (MIRENA) 20 MCG/24HR IUD 1 each by Intrauterine route once.    [provider]  ?TURMERIC PO Take by mouth.    [provider]  ? ? ?Family History ?Family History  ?Problem Relation Age of Onset  ? Cancer Mother 34  ?     liposarcoma of the kidney  ? Hypertension Mother   ? Heart attack Mother   ? Osteoarthritis Mother   ? Stroke Father   ? Dementia Father   ? Diabetes Brother   ? Cancer Maternal Grandfather   ?     pancreatic  ? Diabetes Paternal Grandfather   ? ? ?Social History ?Social History  ? ?Tobacco Use  ? Smoking status: Never  ?  Passive exposure: Never  ? Smokeless tobacco: Never  ?Vaping Use  ? Vaping Use: Never used   ?Substance Use Topics  ? Alcohol use: Not Currently  ? Drug use: No  ? ? ? ?Allergies   ?Neomycin, Septra [sulfamethoxazole-trimethoprim], and Shrimp [shellfish allergy] ? ? ?Review of Systems ?Review of Systems  ?Constitutional: Negative.   ?HENT:  Positive for congestion, facial swelling, postnasal drip, rhinorrhea and sore throat. Negative for trouble swallowing.   ?Eyes: Negative.   ?Respiratory:  Positive for cough, shortness of breath and wheezing.   ?Cardiovascular: Negative.   ?Gastrointestinal: Negative.   ?Skin:  Positive for itching and rash (face).  ?Psychiatric/Behavioral: Negative.    ? ? ?Physical Exam ?Triage Vital Signs ?ED Triage Vitals  ?Enc Vitals Group  ?   BP 06/01/21 1629 (!) 142/86  ?   Pulse Rate 06/01/21 1629 72  ?   Resp 06/01/21 1629 18  ?   Temp 06/01/21 1629 98.6 ?F (37 ?C)  ?   Temp src --   ?   SpO2 06/01/21 1629 97 %  ?   Weight --   ?   Height --   ?   Head Circumference --   ?   Peak Flow --   ?   Pain Score 06/01/21 1628 0  ?   Pain Loc --   ?   Pain Edu? --   ?   Excl. in [place]? --   ? ?No data found. ? ?Updated Vital Signs ?BP (!) 142/86   Pulse 72   Temp 98.6 ?F (37 ?C)   Resp 18   SpO2 97%  ? ?Visual Acuity ?Right Eye Distance:   ?Left Eye Distance:   ?Bilateral Distance:   ? ?Right Eye Near:   ?Left Eye Near:    ?Bilateral Near:    ? ?Physical Exam ?Vitals reviewed.  ?Constitutional:   ?   General: She is not in acute distress. ?   Appearance: Normal appearance.  ?HENT:  ?   Head: Normocephalic and atraumatic.  ?   Right Ear: Tympanic membrane, ear canal and external ear normal.  ?   Left Ear: Tympanic membrane, ear canal and external ear normal.  ?   Nose: Congestion and rhinorrhea present.  ?Eyes:  ?   Extraocular Movements: Extraocular movements intact.  ?   Conjunctiva/sclera: Conjunctivae normal.  ?   Pupils: Pupils are equal, round, and reactive to light.  ?Cardiovascular:  ?   Rate and Rhythm: Normal rate and regular rhythm.  ?   Pulses: Normal pulses.  ?   Heart  sounds: Normal heart sounds.  ?Pulmonary:  ?   Effort: Pulmonary effort is normal.  ?  Breath sounds: Normal breath sounds.  ?Abdominal:  ?   General: Bowel sounds are normal.  ?   Palpations: Abdomen is soft.  ?   Tenderness: There is no abdominal tenderness.  ?Musculoskeletal:  ?   Cervical back: Normal range of motion.  ?Lymphadenopathy:  ?   Cervical: No cervical adenopathy.  ?Skin: ?   General: Skin is warm and dry.  ?   Capillary Refill: Capillary refill takes less than 2 seconds.  ?   Findings: Erythema (cheeks) and rash (cheeks) present.  ?Neurological:  ?   General: No focal deficit present.  ?   Mental Status: She is alert and oriented to person, place, and time.  ?Psychiatric:     ?   Mood and Affect: Mood normal.     ?   Behavior: Behavior normal.  ? ? ? ?UC Treatments / Results  ?Labs ?(all labs ordered are listed, but only abnormal results are displayed) ?Labs Reviewed - No data to display ? ?EKG ? ? ?Radiology ?No results found. ? ?Procedures ?Procedures (including critical care time) ? ?Medications Ordered in UC ?Medications  ?methylPREDNISolone sodium succinate (SOLU-MEDROL) 125 mg/2 mL injection 125 mg (125 mg Intramuscular Given 06/01/21 1701)  ? ? ?Initial Impression / Assessment and Plan / UC Course  ?I have reviewed the triage vital signs and the nursing notes. ? ?Pertinent labs & imaging results that were available during my care of the patient were reviewed by me and considered in my medical decision making (see chart for details). ? ?The patient is a 46 year old female who presents with a possible allergic reaction.  Patient states that her symptoms started about 2 weeks ago after her home was sprayed painted.  Since that time she thinks that she may have also eaten cinnamon, which she is allergic to.  She has been experiencing bronchospasm, facial swelling and lip swelling, and nasal congestion and runny nose.  Unable to determine the patient's trigger, but based on her reactions at this  time, will prescribe symptomatic treatment to help her symptoms.  An EpiPen was also prescribed today.  Do not feel this is an anaphylactic reaction as this has been going on for over a week.  Patient advised to cont

## 2021-06-01 NOTE — ED Triage Notes (Signed)
Pt is present today with a rash on her face and lip swelling, cough, and nasal congestion. Pt sx started last week ago. ?

## 2021-06-01 NOTE — Discharge Instructions (Signed)
Take medication as prescribed. ?Try to monitor things around you and things that you come in contact with to help determine triggers for your symptoms. ?Use your EpiPen as needed. ?Follow-up if symptoms do not improve. ?

## 2021-08-04 ENCOUNTER — Encounter: Payer: Self-pay | Admitting: Obstetrics & Gynecology

## 2021-08-04 ENCOUNTER — Ambulatory Visit (INDEPENDENT_AMBULATORY_CARE_PROVIDER_SITE_OTHER): Payer: No Typology Code available for payment source | Admitting: Obstetrics & Gynecology

## 2021-08-04 VITALS — BP 124/80 | HR 75 | Ht 64.5 in | Wt 184.0 lb

## 2021-08-04 DIAGNOSIS — Z6831 Body mass index (BMI) 31.0-31.9, adult: Secondary | ICD-10-CM

## 2021-08-04 DIAGNOSIS — Z01419 Encounter for gynecological examination (general) (routine) without abnormal findings: Secondary | ICD-10-CM | POA: Diagnosis not present

## 2021-08-04 DIAGNOSIS — Z30431 Encounter for routine checking of intrauterine contraceptive device: Secondary | ICD-10-CM

## 2021-08-04 DIAGNOSIS — E6609 Other obesity due to excess calories: Secondary | ICD-10-CM | POA: Diagnosis not present
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
## 2021-09-24 ENCOUNTER — Other Ambulatory Visit: Payer: Self-pay | Admitting: Obstetrics & Gynecology

## 2021-09-24 DIAGNOSIS — Z1231 Encounter for screening mammogram for malignant neoplasm of breast: Secondary | ICD-10-CM

## 2021-10-06 ENCOUNTER — Ambulatory Visit
Admission: RE | Admit: 2021-10-06 | Discharge: 2021-10-06 | Disposition: A | Discharge status: Home / Self Care | Payer: No Typology Code available for payment source | Source: Ambulatory Visit | Attending: Obstetrics & Gynecology | Admitting: Obstetrics & Gynecology

## 2021-10-06 DIAGNOSIS — Z1231 Encounter for screening mammogram for malignant neoplasm of breast: Secondary | ICD-10-CM

## 2021-11-20 IMAGING — DX DG LUMBAR SPINE COMPLETE 4+V
5 series · 5 of 5 positions shown · non-contrast
Comparison: 02/20/2012

CLINICAL DATA: Chronic back pain for 10 years, history of 2
herniated discs, pain comes and goes, sometime with LEFT hip and
LEFT leg pain, no recent injury

EXAM:
LUMBAR SPINE - COMPLETE 4+ VIEW

[l-spine ap]
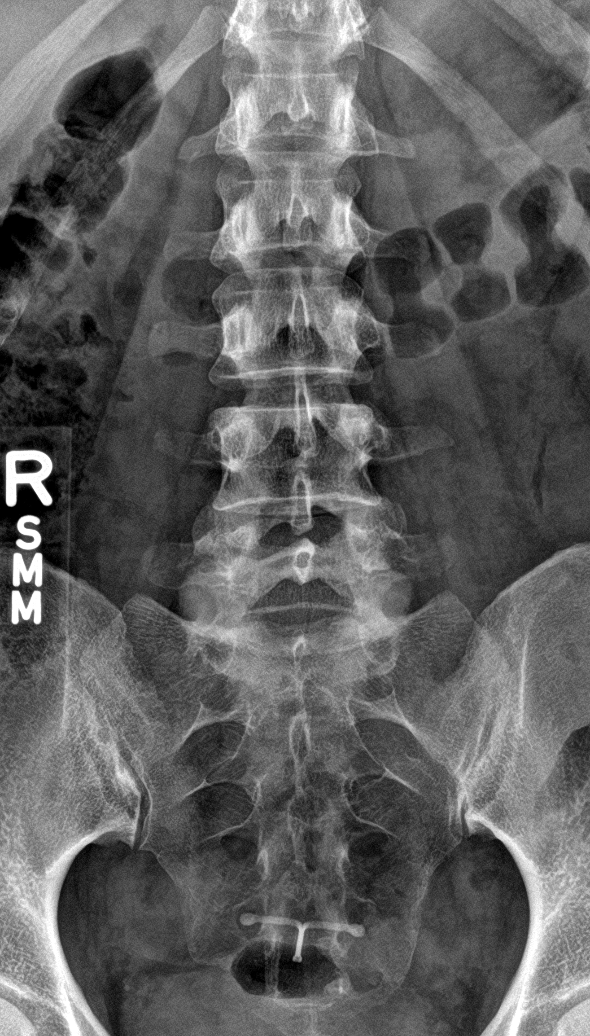

[l-spine obl (1 of 2)]
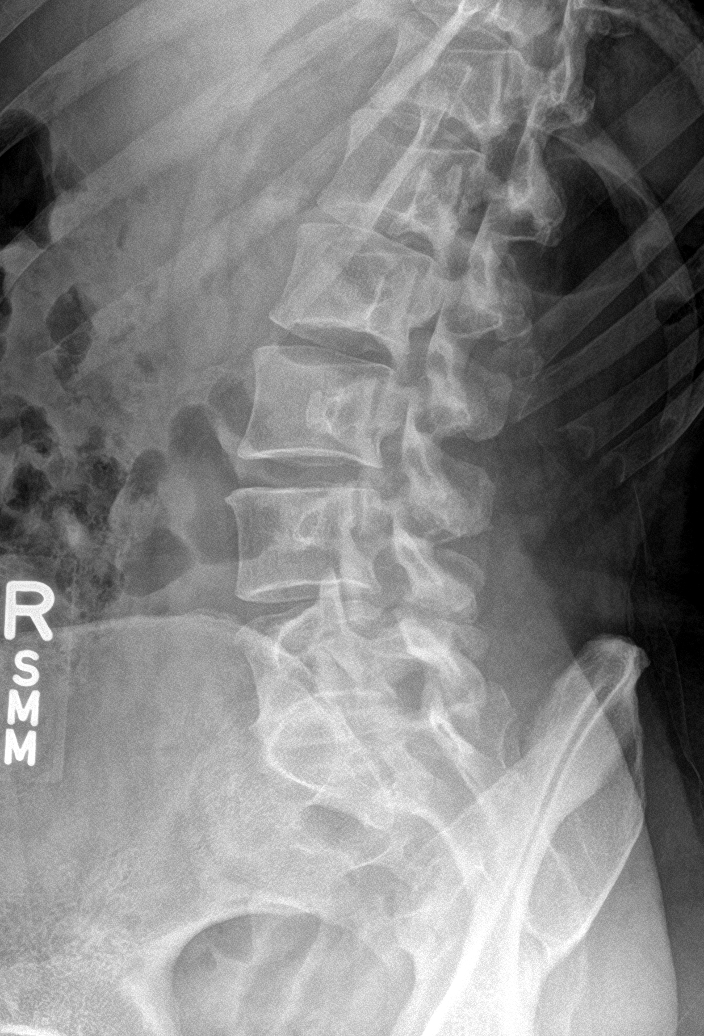

[l-spine obl (2 of 2)]
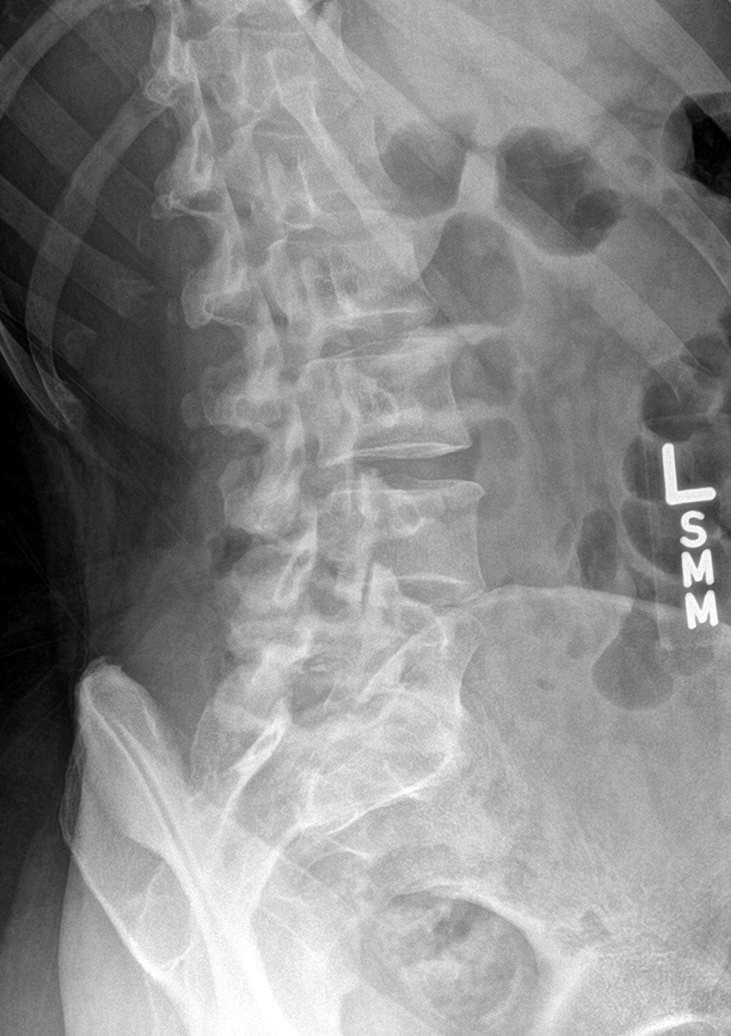

[l-spine lat]
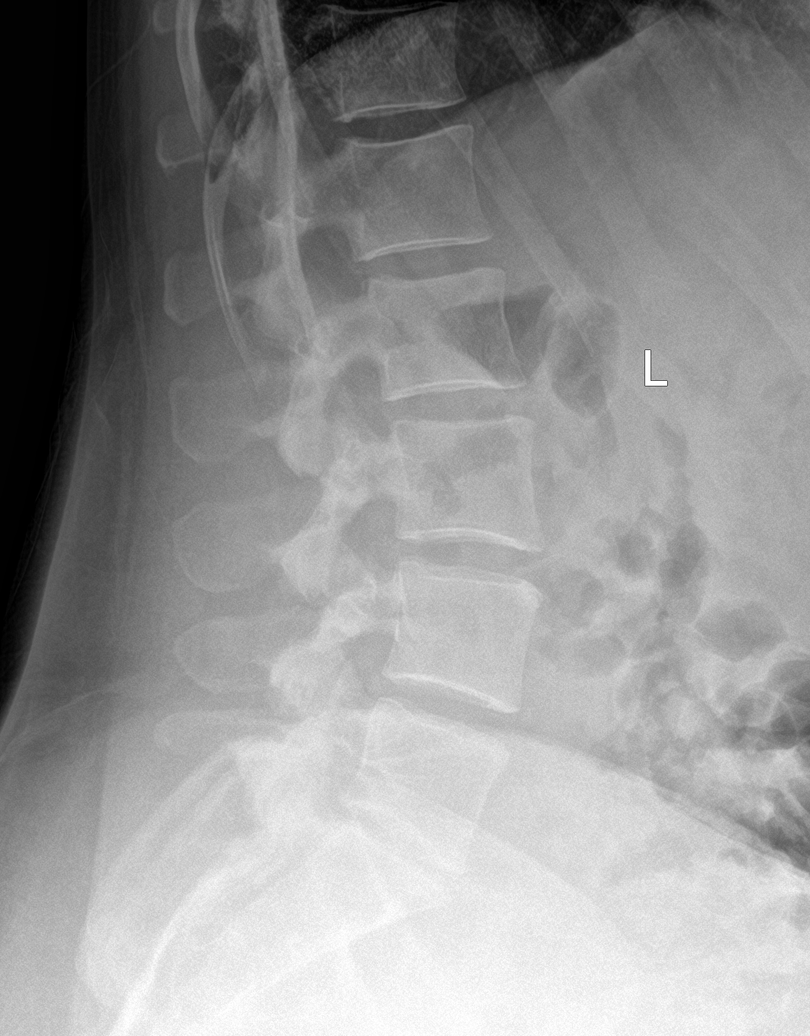

[l-spine spot]
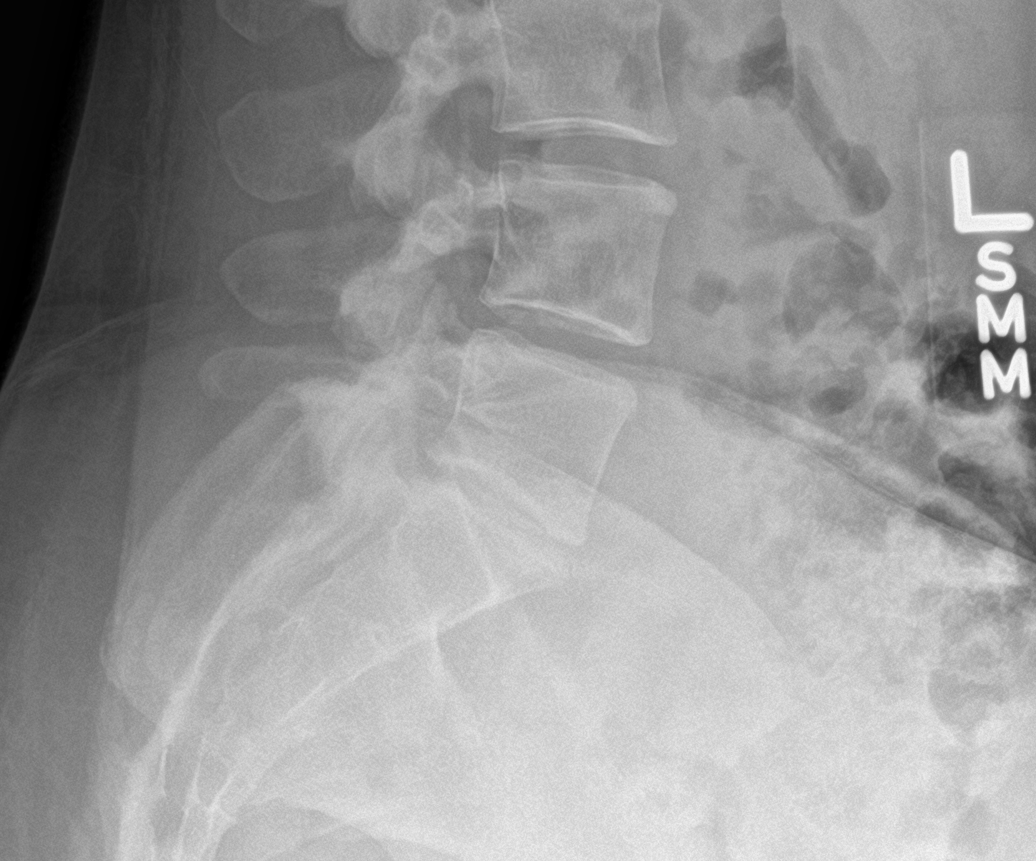

[5 of 5 positions shown; findings below may reference images not displayed]

FINDINGS: 5 non-rib-bearing lumbar vertebra.

Vertebral body and disc space heights maintained.

No fracture, subluxation or bone destruction.

SI joints preserved.

Osseous mineralization normal for technique.

IUD projects over pelvis.
IMPRESSION: Normal exam.

## 2022-02-10 ENCOUNTER — Telehealth: Payer: No Typology Code available for payment source | Admitting: Family Medicine

## 2022-02-10 DIAGNOSIS — J069 Acute upper respiratory infection, unspecified: Secondary | ICD-10-CM | POA: Diagnosis not present

## 2022-02-10 MED ORDER — BENZONATATE 200 MG PO CAPS
200.0000 mg | ORAL_CAPSULE | Freq: Two times a day (BID) | ORAL | 0 refills | Status: DC | PRN
Start: 2022-02-10 — End: 2022-11-01

## 2022-02-10 MED ORDER — FLUTICASONE PROPIONATE 50 MCG/ACT NA SUSP: 2.0000 | Freq: Every day | NASAL | 6 refills | Status: DC

## 2022-02-10 NOTE — Progress Notes (Signed)
E-Visit for Upper Respiratory Infection   We are sorry you are not feeling well.  Here is how we plan to help!  Based on what you have shared with me, it looks like you may have a viral upper respiratory infection.  Upper respiratory infections are caused by a large number of viruses; however, rhinovirus is the most common cause.   Symptoms vary from person to person, with common symptoms including sore throat, cough, fatigue or lack of energy and feeling of general discomfort.  A low-grade fever of up to 100.4 may present, but is often uncommon.  Symptoms vary however, and are closely related to a person's age or underlying illnesses.  The most common symptoms associated with an upper respiratory infection are nasal discharge or congestion, cough, sneezing, headache and pressure in the ears and face.  These symptoms usually persist for about 3 to 10 days, but can last up to 2 weeks.  It is important to know that upper respiratory infections do not cause serious illness or complications in most cases.    Upper respiratory infections can be transmitted from person to person, with the most common method of transmission being a person's hands.  The virus is able to live on the skin and can infect other persons for up to 2 hours after direct contact.  Also, these can be transmitted when someone coughs or sneezes; thus, it is important to cover the mouth to reduce this risk.  To keep the spread of the illness at bay, good hand hygiene is very important.  This is an infection that is most likely caused by a virus. There are no specific treatments other than to help you with the symptoms until the infection runs its course.  We are sorry you are not feeling well.  Here is how we plan to help!   For nasal congestion, you may use an oral decongestants such as Mucinex D or if you have glaucoma or high blood pressure use plain Mucinex.  Saline nasal spray or nasal drops can help and can safely be used as often as  needed for congestion.  For your congestion, I have prescribed Fluticasone nasal spray one spray in each nostril twice a day  If you do not have a history of heart disease, hypertension, diabetes or thyroid disease, prostate/bladder issues or glaucoma, you may also use Sudafed to treat nasal congestion.  It is highly recommended that you consult with a pharmacist or your primary care physician to ensure this medication is safe for you to take.     If you have a cough, you may use cough suppressants such as Delsym and Robitussin.  If you have glaucoma or high blood pressure, you can also use Coricidin HBP.   For cough I have prescribed for you A prescription cough medication called Tessalon Perles 100 mg. You may take 1-2 capsules every 8 hours as needed for cough  If you have a sore or scratchy throat, use a saltwater gargle-  to  teaspoon of salt dissolved in a 4-ounce to 8-ounce glass of warm water.  Gargle the solution for approximately 15-30 seconds and then spit.  It is important not to swallow the solution.  You can also use throat lozenges/cough drops and Chloraseptic spray to help with throat pain or discomfort.  Warm or cold liquids can also be helpful in relieving throat pain.  For headache, pain or general discomfort, you can use Ibuprofen or Tylenol as directed.   Some authorities believe   that zinc sprays or the use of Echinacea may shorten the course of your symptoms.   have provided 5 minutes of non face to face time during this encounter for chart review and documentation.  2   HOME CARE Only take medications as instructed by your medical team. Be sure to drink plenty of fluids. Water is fine as well as fruit juices, sodas and electrolyte beverages. You may want to stay away from caffeine or alcohol. If you are nauseated, try taking small sips of liquids. How do you know if you are getting enough fluid? Your urine should be a pale yellow or almost colorless. Get rest. Taking a  steamy shower or using a humidifier may help nasal congestion and ease sore throat pain. You can place a towel over your head and breathe in the steam from hot water coming from a faucet. Using a saline nasal spray works much the same way. Cough drops, hard candies and sore throat lozenges may ease your cough. Avoid close contacts especially the very young and the elderly Cover your mouth if you cough or sneeze Always remember to wash your hands.   GET HELP RIGHT AWAY IF: You develop worsening fever. If your symptoms do not improve within 10 days You develop yellow or green discharge from your nose over 3 days. You have coughing fits You develop a severe head ache or visual changes. You develop shortness of breath, difficulty breathing or start having chest pain Your symptoms persist after you have completed your treatment plan  MAKE SURE YOU  Understand these instructions. Will watch your condition. Will get help right away if you are not doing well or get worse.  Thank you for choosing an e-visit.  Your e-visit answers were reviewed by a board certified advanced clinical practitioner to complete your personal care plan. Depending upon the condition, your plan could have included both over the counter or prescription medications.  Please review your pharmacy choice. Make sure the pharmacy is open so you can pick up prescription now. If there is a problem, you may contact your provider through [employer] and have the prescription routed to another pharmacy.  Your safety is important to [place]. If you have drug allergies check your prescription carefully.   For the next 24 hours you can use MyChart to ask questions about today's visit, request a non-urgent call back, or ask for a work or school excuse. You will get an email in the next two days asking about your experience. I hope that your e-visit has been valuable and will speed your recovery.  I  have provided 5 minutes of non face  to face time during this encounter for chart review and documentation.

## 2022-08-09 ENCOUNTER — Ambulatory Visit: Payer: No Typology Code available for payment source | Admitting: Obstetrics & Gynecology

## 2022-10-19 ENCOUNTER — Other Ambulatory Visit: Payer: Self-pay | Admitting: Radiology

## 2022-10-19 DIAGNOSIS — Z1231 Encounter for screening mammogram for malignant neoplasm of breast: Secondary | ICD-10-CM

## 2022-11-01 ENCOUNTER — Encounter: Payer: Self-pay | Admitting: Obstetrics and Gynecology

## 2022-11-01 ENCOUNTER — Other Ambulatory Visit (HOSPITAL_COMMUNITY)
Admission: RE | Admit: 2022-11-01 | Discharge: 2022-11-01 | Disposition: A | Discharge status: Home / Self Care | Payer: 59 | Source: Ambulatory Visit | Attending: Obstetrics and Gynecology | Admitting: Obstetrics and Gynecology

## 2022-11-01 ENCOUNTER — Ambulatory Visit (INDEPENDENT_AMBULATORY_CARE_PROVIDER_SITE_OTHER): Payer: 59 | Admitting: Obstetrics and Gynecology

## 2022-11-01 VITALS — BP 110/64 | HR 72 | Ht 64.25 in | Wt 176.0 lb

## 2022-11-01 DIAGNOSIS — Z1211 Encounter for screening for malignant neoplasm of colon: Secondary | ICD-10-CM

## 2022-11-01 DIAGNOSIS — Z01419 Encounter for gynecological examination (general) (routine) without abnormal findings: Secondary | ICD-10-CM | POA: Diagnosis not present

## 2022-11-01 DIAGNOSIS — R232 Flushing: Secondary | ICD-10-CM | POA: Diagnosis not present

## 2022-11-01 DIAGNOSIS — N898 Other specified noninflammatory disorders of vagina: Secondary | ICD-10-CM | POA: Diagnosis not present

## 2022-11-01 NOTE — Addendum Note (Signed)
Addended by: Earley Favor on: 11/01/2022 09:40 AM   Modules accepted: Orders

## 2022-11-01 NOTE — Progress Notes (Addendum)
47 y.o. y.o. female here for annual exam.  H/o VB for 3 months straight. Pleased with mirena IUD now.  No VB Pelvic discharge: denies Pelvic pain: denies Birth control: mirena IUD since 2021 Last mammogram: scheduled for tomorrow Last colonoscopy: referral placed today Reports bothersome hot flashes at night, hair loss, fatigue  Blood pressure 110/64, pulse 72, height 5' 4.25" (1.632 m), weight 176 lb (79.8 kg), SpO2 98%.     Component Value Date/Time   DIAGPAP  07/30/2020 0838    - Negative for intraepithelial lesion or malignancy (NILM)   HPVHIGH Negative 07/30/2020 0838   ADEQPAP  07/30/2020 0838    Satisfactory for evaluation; transformation zone component PRESENT.    GYN HISTORY:    Component Value Date/Time   DIAGPAP  07/30/2020 0838    - Negative for intraepithelial lesion or malignancy (NILM)   HPVHIGH Negative 07/30/2020 0838   ADEQPAP  07/30/2020 0838    Satisfactory for evaluation; transformation zone component PRESENT.    OB History  Gravida Para Term Preterm AB Living  1 0     1 0  SAB IAB Ectopic Multiple Live Births    1          # Outcome Date GA Lbr Len/2nd Weight Sex Type Anes PTL Lv  1 IAB             History reviewed. No pertinent past medical history.  Past Surgical History:  Procedure Laterality Date   INTRAUTERINE DEVICE (IUD) INSERTION     mirena insertion 07-26-19   SKIN TAG REMOVAL     anal    Current Outpatient Medications on File Prior to Visit  Medication Sig Dispense Refill   levonorgestrel (MIRENA) 20 MCG/24HR IUD 1 each by Intrauterine route once.     EPINEPHrine 0.3 mg/0.3 mL IJ SOAJ injection SMARTSIG:0.3 Milligram(s) IM Once (Patient not taking: Reported on 08/04/2021)     No current facility-administered medications on file prior to visit.    Social History   Socioeconomic History   Marital status: Divorced    Spouse name: Not on file   Number of children: Not on file   Years of education: Not on file   Highest  education level: Not on file  Occupational History   Not on file  Tobacco Use   Smoking status: Never    Passive exposure: Never   Smokeless tobacco: Never  Vaping Use   Vaping status: Never Used  Substance and Sexual Activity   Alcohol use: Not Currently   Drug use: No   Sexual activity: Not Currently    Birth control/protection: Abstinence, I.U.D.    Comment: 1st intercourse- 18, partners- 2, mirena inserted 07-26-19  Other Topics Concern   Not on file  Social History Narrative   Not on file   Social Determinants of Health   Financial Resource Strain: Not on file  Food Insecurity: Not on file  Transportation Needs: Not on file  Physical Activity: Not on file  Stress: Not on file  Social Connections: Not on file  Intimate Partner Violence: Not on file    Family History  Problem Relation Age of Onset   Cancer Mother 22       liposarcoma of the kidney   Hypertension Mother    Heart attack Mother    Osteoarthritis Mother    Stroke Father    Dementia Father    Diabetes Brother    Cancer Maternal Grandfather  pancreatic   Diabetes Paternal Grandfather      Allergies  Allergen Reactions   Neomycin     DIARRHEA   Septra [Sulfamethoxazole-Trimethoprim] Diarrhea   Shrimp [Shellfish Allergy]       Patient's last menstrual period was No LMP recorded. (Menstrual status: IUD)..          Sexually active: denies  Exercising: has not yet joined a gym   Review of Systems Alls systems reviewed and are negative.     PE General appearance: alert, cooperative and appears stated age Head: Normocephalic, without obvious abnormality, atraumatic Neck: no adenopathy, supple, symmetrical, trachea midline and thyroid normal to inspection and palpation Lungs: clear to auscultation bilaterally Breasts: normal appearance, no masses or tenderness Heart: regular rate and rhythm Abdomen: soft, non-tender; bowel sounds normal; no masses,  no organomegaly Extremities:  extremities normal, atraumatic, no cyanosis or edema Skin: Skin color, texture, turgor normal. No rashes or lesions Lymph nodes: Cervical, supraclavicular, and axillary nodes normal. No abnormal inguinal nodes palpated Neurologic: Grossly normal     Pelvic: External genitalia:  no lesions              Urethra:  normal appearing urethra with no masses, tenderness or lesions              Bartholins and Skenes: normal                 Vagina: normal appearing vagina with normal color, possible BV no lesions.               Cervix: no lesions, no cervical motion tenderness, IUD strings seen               Bimanual Exam:  Uterus:  normal size, contour, position, consistency, mobility, non-tender              Adnexa: no mass, fullness, tenderness          Chaperone was present for exam.   A:         Well Woman GYN exam                             P:        Pap smear collected             Encouraged annual mammogram screening             Colonoscopy referral placed.  Counseled on the importance             Labs - see orders.  Discussed possible estrogen patch for symptomatic treatment of hot flashes.  To check FSH first and counseled on r/b/a/I of the patch             Discussed breast self exams             Encouraged safe sexual practices and enouraged healthy lifestyle practices with diet and exercise IUD in place for management of menorrhagia and she is placed. Continue for 5-8 years based on bleeding profile.   Earley Favor

## 2022-11-01 NOTE — Addendum Note (Signed)
Addended by: Earley Favor on: 11/01/2022 02:31 PM   Modules accepted: Orders

## 2022-11-02 ENCOUNTER — Other Ambulatory Visit: Payer: Self-pay | Admitting: Obstetrics and Gynecology

## 2022-11-02 ENCOUNTER — Ambulatory Visit
Admission: RE | Admit: 2022-11-02 | Discharge: 2022-11-02 | Disposition: A | Discharge status: Home / Self Care | Payer: 59 | Source: Ambulatory Visit | Attending: Radiology | Admitting: Radiology

## 2022-11-02 DIAGNOSIS — Z1231 Encounter for screening mammogram for malignant neoplasm of breast: Secondary | ICD-10-CM

## 2022-11-02 LAB — CBC
HCT: 41.9 % (ref 35.0–45.0)
Hemoglobin: 13.8 g/dL (ref 11.7–15.5)
MCH: 29.5 pg (ref 27.0–33.0)
MCHC: 32.9 g/dL (ref 32.0–36.0)
MCV: 89.5 fL (ref 80.0–100.0)
MPV: 10.2 fL (ref 7.5–12.5)
Platelets: 327 10*3/uL (ref 140–400)
RBC: 4.68 10*6/uL (ref 3.80–5.10)
RDW: 12.1 % (ref 11.0–15.0)
WBC: 5.6 10*3/uL (ref 3.8–10.8)

## 2022-11-02 LAB — ESTRADIOL: Estradiol: <15 pg/mL

## 2022-11-02 LAB — SURESWAB® ADVANCED VAGINITIS PLUS,TMA
C. trachomatis RNA, TMA: NOT DETECTED
CANDIDA SPECIES: NOT DETECTED
Candida glabrata: NOT DETECTED
N. gonorrhoeae RNA, TMA: NOT DETECTED
SURESWAB(R) ADV BACTERIAL VAGINOSIS(BV),TMA: NEGATIVE
TRICHOMONAS VAGINALIS (TV),TMA: NOT DETECTED

## 2022-11-02 LAB — LIPID PANEL
Cholesterol: 220 mg/dL — ABNORMAL HIGH (ref <200)
HDL: 66 mg/dL (ref >=50)
LDL Cholesterol (Calc): 137 mg/dL — ABNORMAL HIGH
Non-HDL Cholesterol (Calc): 154 mg/dL — ABNORMAL HIGH (ref <130)
Total CHOL/HDL Ratio: 3.3 (calc) (ref <5.0)
Triglycerides: 75 mg/dL (ref <150)

## 2022-11-02 LAB — COMPREHENSIVE METABOLIC PANEL
AG Ratio: 2.1 (calc) (ref 1.0–2.5)
ALT: 12 U/L (ref 6–29)
AST: 12 U/L (ref 10–35)
Albumin: 4.9 g/dL (ref 3.6–5.1)
Alkaline phosphatase (APISO): 79 U/L (ref 31–125)
BUN: 14 mg/dL (ref 7–25)
CO2: 28 mmol/L (ref 20–32)
Calcium: 9.9 mg/dL (ref 8.6–10.2)
Chloride: 104 mmol/L (ref 98–110)
Creat: 0.91 mg/dL (ref 0.50–0.99)
Globulin: 2.3 g/dL (ref 1.9–3.7)
Glucose, Bld: 91 mg/dL (ref 65–99)
Potassium: 4.5 mmol/L (ref 3.5–5.3)
Sodium: 141 mmol/L (ref 135–146)
Total Bilirubin: 0.6 mg/dL (ref 0.2–1.2)
Total Protein: 7.2 g/dL (ref 6.1–8.1)

## 2022-11-02 LAB — FOLLICLE STIMULATING HORMONE: FSH: 106.2 m[IU]/mL

## 2022-11-02 LAB — TSH: TSH: 2.11 m[IU]/L

## 2022-11-02 MED ORDER — ESTRADIOL 0.0375 MG/24HR TD PTWK: 0.0375 mg | MEDICATED_PATCH | TRANSDERMAL | 12 refills | Status: DC

## 2022-11-04 LAB — TESTOS,TOTAL,FREE AND SHBG (FEMALE)
Free Testosterone: 1.1 pg/mL (ref 0.1–6.4)
Sex Hormone Binding: 62.4 nmol/L (ref 17–124)
Testosterone, Total, LC-MS-MS: 15 ng/dL (ref 2–45)

## 2022-11-11 LAB — CYTOLOGY - PAP: Diagnosis: NEGATIVE

## 2022-12-05 ENCOUNTER — Other Ambulatory Visit (HOSPITAL_COMMUNITY): Payer: Self-pay

## 2022-12-05 MED ORDER — ESTRADIOL 0.0375 MG/24HR TD PTWK
0.0375 mg | MEDICATED_PATCH | TRANSDERMAL | 12 refills | Status: AC
  Filled 2022-12-05: qty 4, 28d supply, fill #0
  Filled 2023-01-06: qty 4, 28d supply, fill #1
  Filled 2023-02-02: qty 4, 28d supply, fill #2
  Filled 2023-03-01 – 2023-03-17 (×3): qty 4, 28d supply, fill #3

## 2022-12-06 ENCOUNTER — Other Ambulatory Visit (HOSPITAL_COMMUNITY): Payer: Self-pay

## 2023-01-06 ENCOUNTER — Other Ambulatory Visit: Payer: Self-pay

## 2023-01-06 ENCOUNTER — Other Ambulatory Visit (HOSPITAL_COMMUNITY): Payer: Self-pay

## 2023-02-06 DIAGNOSIS — L84 Corns and callosities: Secondary | ICD-10-CM | POA: Diagnosis not present

## 2023-02-06 DIAGNOSIS — L858 Other specified epidermal thickening: Secondary | ICD-10-CM | POA: Diagnosis not present

## 2023-02-06 DIAGNOSIS — D225 Melanocytic nevi of trunk: Secondary | ICD-10-CM | POA: Diagnosis not present

## 2023-02-06 DIAGNOSIS — D2261 Melanocytic nevi of right upper limb, including shoulder: Secondary | ICD-10-CM | POA: Diagnosis not present

## 2023-02-06 DIAGNOSIS — D224 Melanocytic nevi of scalp and neck: Secondary | ICD-10-CM | POA: Diagnosis not present

## 2023-02-06 DIAGNOSIS — D2271 Melanocytic nevi of right lower limb, including hip: Secondary | ICD-10-CM | POA: Diagnosis not present

## 2023-02-06 DIAGNOSIS — D2262 Melanocytic nevi of left upper limb, including shoulder: Secondary | ICD-10-CM | POA: Diagnosis not present

## 2023-02-06 DIAGNOSIS — L853 Xerosis cutis: Secondary | ICD-10-CM | POA: Diagnosis not present

## 2023-03-01 ENCOUNTER — Other Ambulatory Visit (HOSPITAL_COMMUNITY): Payer: Self-pay

## 2023-03-01 ENCOUNTER — Telehealth: Payer: 59 | Admitting: Family Medicine

## 2023-03-01 DIAGNOSIS — J208 Acute bronchitis due to other specified organisms: Secondary | ICD-10-CM

## 2023-03-01 DIAGNOSIS — B9689 Other specified bacterial agents as the cause of diseases classified elsewhere: Secondary | ICD-10-CM

## 2023-03-01 MED ORDER — PROMETHAZINE-DM 6.25-15 MG/5ML PO SYRP: 5.0000 mL | ORAL_SOLUTION | Freq: Four times a day (QID) | ORAL | 0 refills | Status: DC | PRN

## 2023-03-01 MED ORDER — AZITHROMYCIN 250 MG PO TABS: ORAL_TABLET | ORAL | 0 refills | Status: AC

## 2023-03-01 NOTE — Progress Notes (Signed)
 Virtual Visit Consent   [name], you are scheduled for a virtual visit with a [facility] provider today. Just as with appointments in the office, your consent must be obtained to participate. Your consent will be active for this visit and any virtual visit you may have with one of our providers in the next 365 days. If you have a MyChart account, a copy of this consent can be sent to you electronically.  As this is a virtual visit, video technology does not allow for your provider to perform a traditional examination. This may limit your provider's ability to fully assess your condition. If your provider identifies any concerns that need to be evaluated in person or the need to arrange testing (such as labs, EKG, etc.), we will make arrangements to do so. Although advances in technology are sophisticated, we cannot ensure that it will always work on either your end or our end. If the connection with a video visit is poor, the visit may have to be switched to a telephone visit. With either a video or telephone visit, we are not always able to ensure that we have a secure connection.  By engaging in this virtual visit, you consent to the provision of healthcare and authorize for your insurance to be billed (if applicable) for the services provided during this visit. Depending on your insurance coverage, you may receive a charge related to this service.  I need to obtain your verbal consent now. Are you willing to proceed with your visit today? Anne Haas has provided verbal consent on 03/01/2023 for a virtual visit (video or telephone). Chiquita CHRISTELLA Barefoot, NP  Date: 03/01/2023 11:07 AM  Virtual Visit via Video Note   I, Chiquita CHRISTELLA Barefoot, connected with  Anne Haas  ([id number], 01/21/1976) on 03/01/23 at 11:15 AM EST by a video-enabled telemedicine application and verified that I am speaking with the correct person using two identifiers.  Location: Patient: Virtual Visit Location Patient:  Home Provider: Virtual Visit Location Provider: Home Office   I discussed the limitations of evaluation and management by telemedicine and the availability of in person appointments. The patient expressed understanding and agreed to proceed.    History of Present Illness: Anne Haas is a 48 y.o. who identifies as a female who was assigned female at birth, and is being seen today for bronchitis  Onset was 6-7 days ago-sore and congestion. Progressed to cold symptoms over the weekend. Some chills over the weekend.  Associated symptoms are coughing yellow mucus up now, and pain with coughing after coughing so much. Fever his morning 100.3 when waking today.  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  Problems:  Patient Active Problem List   Diagnosis Date Noted   Bite, insect 08/04/2016    Allergies:  Allergies  Allergen Reactions   Neomycin     DIARRHEA   Septra [Sulfamethoxazole-Trimethoprim] Diarrhea   Shrimp [Shellfish Allergy]    Medications:  Current Outpatient Medications:    estradiol  (CLIMARA ) 0.0375 mg/24hr patch, Place 1 patch (0.0375 mg total) onto the skin once a week., Disp: 4 patch, Rfl: 12   EPINEPHrine  0.3 mg/0.3 mL IJ SOAJ injection, SMARTSIG:0.3 Milligram(s) IM Once (Patient not taking: Reported on 08/04/2021), Disp: , Rfl:    estradiol  (CLIMARA  - DOSED IN MG/24 HR) 0.0375  mg/24hr patch, Place 1 patch (0.0375 mg total) onto the skin once a week., Disp: 4 patch, Rfl: 12   levonorgestrel  (MIRENA ) 20 MCG/24HR IUD, 1 each by Intrauterine route once., Disp: , Rfl:   Observations/Objective: Patient is well-developed, well-nourished in no acute distress.  Resting comfortably  at home.  Head is normocephalic, atraumatic.  No labored breathing.  Speech is clear and coherent with logical content.  Patient is  alert and oriented at baseline.  Congestion and cough noted   Assessment and Plan:  1. Acute bacterial bronchitis (Primary)  - azithromycin  (ZITHROMAX ) 250 MG tablet; Take 2 tablets on day 1, then 1 tablet daily on days 2 through 5  Dispense: 6 tablet; Refill: 0 - promethazine -dextromethorphan (PROMETHAZINE -DM) 6.25-15 MG/5ML syrup; Take 5 mLs by mouth 4 (four) times daily as needed for cough.  Dispense: 118 mL; Refill: 0  - Take meds as prescribed - Rest voice - Use a cool mist humidifier especially during the winter months when heat dries out the air. - Use saline nose sprays frequently to help soothe nasal passages if they are drying out. - Stay hydrated by drinking plenty of fluids - Keep thermostat turn down low to prevent drying out which can cause a dry cough.  - For fever or aches or pains- take tylenol or ibuprofen  as directed on bottle             * for fevers greater than 101 orally you may alternate ibuprofen  and tylenol every 3 hours.  If you do not improve you will need a follow up visit in person.               Reviewed side effects, risks and benefits of medication.    Patient acknowledged agreement and understanding of the plan.   Past Medical, Surgical, Social History, Allergies, and Medications have been Reviewed.    Follow Up Instructions: I discussed the assessment and treatment plan with the patient. The patient was provided an opportunity to ask questions and all were answered. The patient agreed with the plan and demonstrated an understanding of the instructions.  A copy of instructions were sent to the patient via MyChart unless otherwise noted below.    The patient was advised to call back or seek an in-person evaluation if the symptoms worsen or if the condition fails to improve as anticipated.    Chiquita CHRISTELLA Barefoot, NP

## 2023-03-13 ENCOUNTER — Other Ambulatory Visit (HOSPITAL_COMMUNITY): Payer: Self-pay

## 2023-03-17 ENCOUNTER — Other Ambulatory Visit (HOSPITAL_COMMUNITY): Payer: Self-pay

## 2023-03-21 ENCOUNTER — Other Ambulatory Visit (HOSPITAL_COMMUNITY): Payer: Self-pay

## 2023-07-21 ENCOUNTER — Other Ambulatory Visit (HOSPITAL_COMMUNITY): Payer: Self-pay | Admitting: Family Medicine

## 2023-07-21 ENCOUNTER — Ambulatory Visit (HOSPITAL_COMMUNITY)
Admission: RE | Admit: 2023-07-21 | Discharge: 2023-07-21 | Disposition: A | Discharge status: Home / Self Care | Payer: Self-pay | Source: Ambulatory Visit | Attending: Family Medicine | Admitting: Family Medicine

## 2023-07-21 DIAGNOSIS — R52 Pain, unspecified: Secondary | ICD-10-CM | POA: Diagnosis present

## 2023-07-21 DIAGNOSIS — M25512 Pain in left shoulder: Secondary | ICD-10-CM | POA: Diagnosis not present

## 2023-09-03 ENCOUNTER — Telehealth: Admitting: Family Medicine

## 2023-09-03 DIAGNOSIS — J208 Acute bronchitis due to other specified organisms: Secondary | ICD-10-CM | POA: Diagnosis not present

## 2023-09-03 DIAGNOSIS — B9689 Other specified bacterial agents as the cause of diseases classified elsewhere: Secondary | ICD-10-CM | POA: Diagnosis not present

## 2023-09-03 DIAGNOSIS — J019 Acute sinusitis, unspecified: Secondary | ICD-10-CM

## 2023-09-03 MED ORDER — AMOXICILLIN-POT CLAVULANATE 875-125 MG PO TABS: 1.0000 | ORAL_TABLET | Freq: Two times a day (BID) | ORAL | 0 refills | Status: AC

## 2023-09-03 NOTE — Progress Notes (Signed)
 Virtual Visit Consent   [name], you are scheduled for a virtual visit with a [facility] provider today. Just as with appointments in the office, your consent must be obtained to participate. Your consent will be active for this visit and any virtual visit you may have with one of our providers in the next 365 days. If you have a MyChart account, a copy of this consent can be sent to you electronically.  As this is a virtual visit, video technology does not allow for your provider to perform a traditional examination. This may limit your provider's ability to fully assess your condition. If your provider identifies any concerns that need to be evaluated in person or the need to arrange testing (such as labs, EKG, etc.), we will make arrangements to do so. Although advances in technology are sophisticated, we cannot ensure that it will always work on either your end or our end. If the connection with a video visit is poor, the visit may have to be switched to a telephone visit. With either a video or telephone visit, we are not always able to ensure that we have a secure connection.  By engaging in this virtual visit, you consent to the provision of healthcare and authorize for your insurance to be billed (if applicable) for the services provided during this visit. Depending on your insurance coverage, you may receive a charge related to this service.  I need to obtain your verbal consent now. Are you willing to proceed with your visit today? Anne Haas has provided verbal consent on 09/03/2023 for a virtual visit (video or telephone). Loa Lamp, FNP  Date: 09/03/2023 11:15 AM   Virtual Visit via Video Note   I, Loa Lamp, connected with  Anne Haas  ([id number], [birth year]) on 09/03/23 at 11:15 AM EDT by a video-enabled telemedicine application and verified that I am speaking with the correct person using two identifiers.  Location: Patient: Virtual Visit Location Patient: Home Provider:  Virtual Visit Location Provider: Home Office   I discussed the limitations of evaluation and management by telemedicine and the availability of in person appointments. The patient expressed understanding and agreed to proceed.    History of Present Illness: Anne Haas is a 48 y.o. who identifies as a female who was assigned female at birth, and is being seen today for runny nose, sinus pain and pressure, pain in both ears, worse yesterday, increased cough, fever 100.2 up to 102.1. Sx for 4-5 days. 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 .  HPI: HPI  Problems:  Patient Active Problem List   Diagnosis Date Noted  . Bite, insect 08/04/2016    Allergies:  Allergies  Allergen Reactions  . Neomycin     DIARRHEA  . Septra [Sulfamethoxazole-Trimethoprim] Diarrhea  . Shrimp [Shellfish Allergy]    Medications:  Current Outpatient Medications:  .  estradiol  (CLIMARA ) 0.0375 mg/24hr patch, Place 1 patch (0.0375 mg total) onto the skin once a week., Disp: 4 patch, Rfl: 12 .  EPINEPHrine  0.3 mg/0.3 mL IJ SOAJ injection, SMARTSIG:0.3 Milligram(s) IM Once (Patient not taking: Reported on 08/04/2021), Disp: , Rfl:  .  estradiol  (CLIMARA  - DOSED IN MG/24 HR) 0.0375 mg/24hr patch, Place 1 patch (0.0375 mg total) onto the skin once a week., Disp: 4 patch, Rfl: 12 .  levonorgestrel  (MIRENA ) 20 MCG/24HR IUD, 1 each by Intrauterine route once., Disp: , Rfl:  .  promethazine -dextromethorphan (PROMETHAZINE -DM) 6.25-15 MG/5ML syrup, Take 5 mLs by mouth  4 (four) times daily as needed for cough., Disp: 118 mL, Rfl: 0  Observations/Objective: Patient is well-developed, well-nourished in no acute distress.  Resting comfortably  at home.  Head is normocephalic, atraumatic.  No labored breathing.  Speech is clear and coherent with logical content.  Patient is alert and oriented at baseline.    Assessment and Plan: There are no diagnoses linked to this encounter. Increase fluids, humidifier at night,  message with covid results.  Follow Up Instructions: I discussed the assessment and treatment plan with the patient. The patient was provided an opportunity to ask questions and all were answered. The patient agreed with the plan and demonstrated an understanding of the instructions.  A copy of instructions were sent to the patient via MyChart unless otherwise noted below.     The patient was advised to call back or seek an in-person evaluation if the symptoms worsen or if the condition fails to improve as anticipated.    Jamillia Closson, FNP

## 2023-09-12 MED ORDER — BENZONATATE 100 MG PO CAPS: 200.0000 mg | ORAL_CAPSULE | Freq: Three times a day (TID) | ORAL | 0 refills | Status: AC | PRN

## 2023-09-12 NOTE — Patient Instructions (Signed)

## 2023-11-02 ENCOUNTER — Ambulatory Visit (INDEPENDENT_AMBULATORY_CARE_PROVIDER_SITE_OTHER): Admitting: Obstetrics and Gynecology

## 2023-11-02 ENCOUNTER — Encounter: Payer: Self-pay | Admitting: Obstetrics and Gynecology

## 2023-11-02 VITALS — BP 102/60 | HR 57 | Ht 64.96 in | Wt 180.4 lb

## 2023-11-02 DIAGNOSIS — Z1331 Encounter for screening for depression: Secondary | ICD-10-CM

## 2023-11-02 DIAGNOSIS — E2839 Other primary ovarian failure: Secondary | ICD-10-CM

## 2023-11-02 DIAGNOSIS — Z01419 Encounter for gynecological examination (general) (routine) without abnormal findings: Secondary | ICD-10-CM

## 2023-11-02 DIAGNOSIS — R232 Flushing: Secondary | ICD-10-CM

## 2023-11-02 DIAGNOSIS — Z1231 Encounter for screening mammogram for malignant neoplasm of breast: Secondary | ICD-10-CM

## 2023-11-02 DIAGNOSIS — Z1211 Encounter for screening for malignant neoplasm of colon: Secondary | ICD-10-CM

## 2023-11-02 MED ORDER — ESTRADIOL 0.0375 MG/24HR TD PTWK: 0.0375 mg | MEDICATED_PATCH | TRANSDERMAL | 12 refills | Status: AC

## 2023-11-02 NOTE — Progress Notes (Addendum)
 48 y.o. y.o. female here for annual exam. No LMP recorded. (Menstrual status: IUD).   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 Pleased with mirena  IUD now.  No VB Pelvic discharge: denies Pelvic pain: denies Birth control: mirena  IUD since 2021 Last mammogram: 11/04/22 Last colonoscopy: referral placed for cologuard. Did not do last year with colonoscopy referral, but feels like she can get the colguard done. No family history of colon cancer Reports bothersome hot flashes at night, hair loss, fatigue- reports this has resolved this visit with the vivelle  0.037 patch (using good RX 30$ a month). She would like to keep the mirena  IUD in for endometrial protection for HRT use. She is sleeping well and does not have the hot flashes at this dose. No vaginal dryness. Not sexually active. Dxa: to get baseline with premature menopause, mother with osteoporosis and fractures FSH 106.2 10/28/22  Body mass index is 30.06 kg/m.     11/02/2023    8:10 AM  Depression screen PHQ 2/9  Decreased Interest 0  Down, Depressed, Hopeless 0  PHQ - 2 Score 0     Blood pressure 102/60, pulse (!) 57, height 5' 4.96 (1.65 m), weight 180 lb 6.4 oz (81.8 kg), SpO2 97%.     Component Value Date/Time   DIAGPAP  11/01/2022 1427    - Negative for intraepithelial lesion or malignancy (NILM)   DIAGPAP  07/30/2020 0838    - Negative for intraepithelial lesion or malignancy (NILM)   HPVHIGH Negative 07/30/2020 0838   ADEQPAP  11/01/2022 1427    Satisfactory for evaluation; transformation zone component PRESENT.   ADEQPAP  07/30/2020 0838    Satisfactory for evaluation; transformation zone component PRESENT.    GYN HISTORY:    Component Value Date/Time   DIAGPAP  11/01/2022 1427    - Negative for intraepithelial lesion or malignancy (NILM)   DIAGPAP  07/30/2020 0838    - Negative for intraepithelial lesion or malignancy (NILM)   HPVHIGH Negative 07/30/2020 0838   ADEQPAP   11/01/2022 1427    Satisfactory for evaluation; transformation zone component PRESENT.   ADEQPAP  07/30/2020 0838    Satisfactory for evaluation; transformation zone component PRESENT.    OB History  Gravida Para Term Preterm AB Living  1 0   1 0  SAB IAB Ectopic Multiple Live Births   1       # Outcome Date GA Lbr Len/2nd Weight Sex Type Anes PTL Lv  1 IAB             No past medical history on file.  Past Surgical History:  Procedure Laterality Date   INTRAUTERINE DEVICE (IUD) INSERTION     mirena  insertion 07-26-19   SKIN TAG REMOVAL     anal    Current Outpatient Medications on File Prior to Visit  Medication Sig Dispense Refill   EPINEPHrine  0.3 mg/0.3 mL IJ SOAJ injection SMARTSIG:0.3 Milligram(s) IM Once     estradiol  (CLIMARA  - DOSED IN MG/24 HR) 0.0375 mg/24hr patch Place 1 patch (0.0375 mg total) onto the skin once a week. 4 patch 12   levonorgestrel  (MIRENA ) 20 MCG/24HR IUD 1 each by Intrauterine route once.     Multiple Vitamins-Minerals (WOMENS 50+ MULTI VITAMIN PO) Take by mouth.     Saccharomyces boulardii (PROBIOTIC) 250 MG CAPS Take by mouth.     amoxicillin -clavulanate (AUGMENTIN ) 875-125 MG tablet Take 1 tablet by mouth 2 (two) times daily. (Patient not taking:  Reported on 11/02/2023) 20 tablet 0   No current facility-administered medications on file prior to visit.    Social History   Socioeconomic History   Marital status: Divorced    Spouse name: Not on file   Number of children: Not on file   Years of education: Not on file   Highest education level: Not on file  Occupational History   Not on file  Tobacco Use   Smoking status: Never    Passive exposure: Never   Smokeless tobacco: Never  Vaping Use   Vaping status: Never Used  Substance and Sexual Activity   Alcohol use: Not Currently   Drug use: No   Sexual activity: Not Currently    Birth control/protection: Abstinence, I.U.D.    Comment: 1st intercourse- 18, partners- 2, mirena   inserted 07-26-19  Other Topics Concern   Not on file  Social History Narrative   Not on file   Social Drivers of Health   Financial Resource Strain: Not on file  Food Insecurity: Not on file  Transportation Needs: Not on file  Physical Activity: Not on file  Stress: Not on file  Social Connections: Not on file  Intimate Partner Violence: Not on file    Family History  Problem Relation Age of Onset   Cancer Mother 52       liposarcoma of the kidney   Hypertension Mother    Heart attack Mother    Osteoarthritis Mother    Stroke Father    Dementia Father    Diabetes Brother    Cancer Maternal Grandfather        pancreatic   Diabetes Paternal Grandfather      Allergies  Allergen Reactions   Neomycin     DIARRHEA   Septra [Sulfamethoxazole-Trimethoprim] Diarrhea   Shrimp [Shellfish Allergy]       Patient's last menstrual period was No LMP recorded. (Menstrual status: IUD)..          Sexually active: no  Exercising: goal to watch diet and exercise   Review of Systems Alls systems reviewed and are negative.     Physical Exam Constitutional:      Appearance: Normal appearance.  Genitourinary:     Vulva and urethral meatus normal.     No lesions in the vagina.     Right Labia: No rash, lesions or skin changes.    Left Labia: No lesions, skin changes or rash.    No vaginal discharge or tenderness.     No vaginal prolapse present.    No vaginal atrophy present.     Right Adnexa: not tender, not palpable and no mass present.    Left Adnexa: not tender, not palpable and no mass present.    No cervical motion tenderness or discharge.     IUD strings visualized.     Uterus is not enlarged, tender or irregular.  Breasts:    Right: Normal.     Left: Normal.  HENT:     Head: Normocephalic.  Neck:     Thyroid : No thyroid  mass, thyromegaly or thyroid  tenderness.  Cardiovascular:     Rate and Rhythm: Normal rate and regular rhythm.     Heart sounds: Normal  heart sounds, S1 normal and S2 normal.  Pulmonary:     Effort: Pulmonary effort is normal.     Breath sounds: Normal breath sounds and air entry.  Abdominal:     General: There is no distension.     Palpations: Abdomen is soft.  There is no mass.     Tenderness: There is no abdominal tenderness. There is no guarding or rebound.  Musculoskeletal:        General: Normal range of motion.     Cervical back: Full passive range of motion without pain, normal range of motion and neck supple. No tenderness.     Right lower leg: No edema.     Left lower leg: No edema.  Neurological:     Mental Status: She is alert.  Skin:    General: Skin is warm.  Psychiatric:        Mood and Affect: Mood normal.        Behavior: Behavior normal.        Thought Content: Thought content normal.  Vitals and nursing note reviewed. Exam conducted with a chaperone present.       A:         Well Woman GYN exam,  premature menopause,  family history of osteoporosis with likely fractures in mother,  doing well on current HRT dosing,  mirena  IUD (2021)                             P:        Pap smear not indicated Encouraged annual mammogram screening Colon cancer screening referral placed today DXA ordered today Labs and immunizations ordered today Discussed breast self exams Encouraged healthy lifestyle practices Encouraged Vit D and Calcium   No follow-ups on file.  Anne Haas

## 2023-11-03 ENCOUNTER — Ambulatory Visit: Payer: Self-pay | Admitting: Obstetrics and Gynecology

## 2023-11-03 LAB — LIPID PANEL
Cholesterol: 244 mg/dL — ABNORMAL HIGH (ref <200)
HDL: 66 mg/dL (ref >=50)
LDL Cholesterol (Calc): 160 mg/dL — ABNORMAL HIGH
Non-HDL Cholesterol (Calc): 178 mg/dL — ABNORMAL HIGH (ref <130)
Total CHOL/HDL Ratio: 3.7 (calc) (ref <5.0)
Triglycerides: 77 mg/dL (ref <150)

## 2023-11-03 LAB — CBC
HCT: 42.2 % (ref 35.0–45.0)
Hemoglobin: 13.7 g/dL (ref 11.7–15.5)
MCH: 30.8 pg (ref 27.0–33.0)
MCHC: 32.5 g/dL (ref 32.0–36.0)
MCV: 94.8 fL (ref 80.0–100.0)
MPV: 10.2 fL (ref 7.5–12.5)
Platelets: 334 Thousand/uL (ref 140–400)
RBC: 4.45 Million/uL (ref 3.80–5.10)
RDW: 12.2 % (ref 11.0–15.0)
WBC: 6.9 Thousand/uL (ref 3.8–10.8)

## 2023-11-03 LAB — COMPREHENSIVE METABOLIC PANEL WITH GFR
AG Ratio: 1.8 (calc) (ref 1.0–2.5)
ALT: 19 U/L (ref 6–29)
AST: 19 U/L (ref 10–35)
Albumin: 4.6 g/dL (ref 3.6–5.1)
Alkaline phosphatase (APISO): 73 U/L (ref 31–125)
BUN: 12 mg/dL (ref 7–25)
CO2: 29 mmol/L (ref 20–32)
Calcium: 9.7 mg/dL (ref 8.6–10.2)
Chloride: 103 mmol/L (ref 98–110)
Creat: 0.84 mg/dL (ref 0.50–0.99)
Globulin: 2.5 g/dL (ref 1.9–3.7)
Glucose, Bld: 93 mg/dL (ref 65–99)
Potassium: 4.4 mmol/L (ref 3.5–5.3)
Sodium: 142 mmol/L (ref 135–146)
Total Bilirubin: 0.6 mg/dL (ref 0.2–1.2)
Total Protein: 7.1 g/dL (ref 6.1–8.1)
eGFR: 87 mL/min/1.73m2 (ref >=60)

## 2023-11-03 LAB — HEMOGLOBIN A1C
Hgb A1c MFr Bld: 5.2 % (ref <5.7)
Mean Plasma Glucose: 103 mg/dL
eAG (mmol/L): 5.7 mmol/L

## 2023-11-03 LAB — TSH: TSH: 1.96 m[IU]/L

## 2023-11-03 LAB — VITAMIN D 25 HYDROXY (VIT D DEFICIENCY, FRACTURES): Vit D, 25-Hydroxy: 39 ng/mL (ref 30–100)

## 2023-11-14 ENCOUNTER — Ambulatory Visit
Admission: RE | Admit: 2023-11-14 | Discharge: 2023-11-14 | Disposition: A | Discharge status: Home / Self Care | Source: Ambulatory Visit | Attending: Obstetrics and Gynecology | Admitting: Obstetrics and Gynecology

## 2023-11-14 DIAGNOSIS — Z1231 Encounter for screening mammogram for malignant neoplasm of breast: Secondary | ICD-10-CM | POA: Diagnosis not present

## 2023-11-14 DIAGNOSIS — Z01419 Encounter for gynecological examination (general) (routine) without abnormal findings: Secondary | ICD-10-CM

## 2023-11-17 LAB — COLOGUARD: COLOGUARD: NEGATIVE

## 2023-12-04 ENCOUNTER — Ambulatory Visit (HOSPITAL_BASED_OUTPATIENT_CLINIC_OR_DEPARTMENT_OTHER)
Admission: RE | Admit: 2023-12-04 | Discharge: 2023-12-04 | Disposition: A | Discharge status: Home / Self Care | Source: Ambulatory Visit | Attending: Obstetrics and Gynecology | Admitting: Obstetrics and Gynecology

## 2023-12-04 DIAGNOSIS — Z01419 Encounter for gynecological examination (general) (routine) without abnormal findings: Secondary | ICD-10-CM | POA: Diagnosis not present

## 2023-12-04 DIAGNOSIS — E2839 Other primary ovarian failure: Secondary | ICD-10-CM | POA: Diagnosis not present

## 2023-12-04 DIAGNOSIS — Z8262 Family history of osteoporosis: Secondary | ICD-10-CM | POA: Diagnosis not present

## 2023-12-06 ENCOUNTER — Other Ambulatory Visit (HOSPITAL_COMMUNITY): Payer: Self-pay

## 2024-02-06 DIAGNOSIS — D2271 Melanocytic nevi of right lower limb, including hip: Secondary | ICD-10-CM | POA: Diagnosis not present

## 2024-02-06 DIAGNOSIS — L858 Other specified epidermal thickening: Secondary | ICD-10-CM | POA: Diagnosis not present

## 2024-02-06 DIAGNOSIS — D2262 Melanocytic nevi of left upper limb, including shoulder: Secondary | ICD-10-CM | POA: Diagnosis not present

## 2024-02-06 DIAGNOSIS — D224 Melanocytic nevi of scalp and neck: Secondary | ICD-10-CM | POA: Diagnosis not present

## 2024-02-06 DIAGNOSIS — L84 Corns and callosities: Secondary | ICD-10-CM | POA: Diagnosis not present

## 2024-02-06 DIAGNOSIS — D2261 Melanocytic nevi of right upper limb, including shoulder: Secondary | ICD-10-CM | POA: Diagnosis not present

## 2024-02-06 DIAGNOSIS — D2222 Melanocytic nevi of left ear and external auricular canal: Secondary | ICD-10-CM | POA: Diagnosis not present

## 2024-02-06 DIAGNOSIS — D225 Melanocytic nevi of trunk: Secondary | ICD-10-CM | POA: Diagnosis not present

## 2024-02-06 DIAGNOSIS — L309 Dermatitis, unspecified: Secondary | ICD-10-CM | POA: Diagnosis not present

## 2024-02-06 DIAGNOSIS — D1801 Hemangioma of skin and subcutaneous tissue: Secondary | ICD-10-CM | POA: Diagnosis not present
